# Patient Record
Sex: Male | Born: 1965 | Race: White | Hispanic: No | Marital: Married | Smoking: Never smoker
Health system: Southern US, Community
[De-identification: ages and names within clinical notes are randomized; demographics above are authoritative.]

## PROBLEM LIST (undated history)

## (undated) DIAGNOSIS — G4730 Sleep apnea, unspecified: Secondary | ICD-10-CM

## (undated) DIAGNOSIS — I10 Essential (primary) hypertension: Secondary | ICD-10-CM

## (undated) DIAGNOSIS — E119 Type 2 diabetes mellitus without complications: Secondary | ICD-10-CM

## (undated) DIAGNOSIS — E785 Hyperlipidemia, unspecified: Secondary | ICD-10-CM

## (undated) HISTORY — DX: Type 2 diabetes mellitus without complications: E11.9

## (undated) HISTORY — PX: ELBOW SURGERY: SHX618

## (undated) HISTORY — DX: Essential (primary) hypertension: I10

## (undated) HISTORY — PX: KNEE SURGERY: SHX244

## (undated) HISTORY — DX: Hyperlipidemia, unspecified: E78.5

---

## 2001-04-27 ENCOUNTER — Ambulatory Visit (HOSPITAL_COMMUNITY): Admission: RE | Admit: 2001-04-27 | Discharge: 2001-04-27 | Payer: Self-pay | Admitting: Orthopedic Surgery

## 2001-04-27 ENCOUNTER — Encounter (INDEPENDENT_AMBULATORY_CARE_PROVIDER_SITE_OTHER): Payer: Self-pay | Admitting: Specialist

## 2001-11-03 ENCOUNTER — Ambulatory Visit (HOSPITAL_COMMUNITY): Admission: RE | Admit: 2001-11-03 | Discharge: 2001-11-03 | Payer: Self-pay | Admitting: Family Medicine

## 2001-11-03 ENCOUNTER — Encounter: Payer: Self-pay | Admitting: Family Medicine

## 2001-11-17 ENCOUNTER — Ambulatory Visit (HOSPITAL_COMMUNITY): Admission: RE | Admit: 2001-11-17 | Discharge: 2001-11-17 | Payer: Self-pay | Admitting: *Deleted

## 2001-11-21 ENCOUNTER — Ambulatory Visit: Admission: RE | Admit: 2001-11-21 | Discharge: 2001-11-21 | Payer: Self-pay | Admitting: Family Medicine

## 2002-01-04 ENCOUNTER — Encounter: Admission: RE | Admit: 2002-01-04 | Discharge: 2002-04-04 | Payer: Self-pay | Admitting: Family Medicine

## 2004-03-18 ENCOUNTER — Ambulatory Visit (HOSPITAL_COMMUNITY): Admission: RE | Admit: 2004-03-18 | Discharge: 2004-03-18 | Payer: Self-pay | Admitting: Specialist

## 2004-04-09 ENCOUNTER — Ambulatory Visit (HOSPITAL_COMMUNITY): Admission: RE | Admit: 2004-04-09 | Discharge: 2004-04-09 | Payer: Self-pay | Admitting: Orthopedic Surgery

## 2004-12-24 ENCOUNTER — Ambulatory Visit (HOSPITAL_COMMUNITY): Admission: RE | Admit: 2004-12-24 | Discharge: 2004-12-24 | Payer: Self-pay | Admitting: Orthopedic Surgery

## 2005-03-02 ENCOUNTER — Ambulatory Visit (HOSPITAL_COMMUNITY): Admission: RE | Admit: 2005-03-02 | Discharge: 2005-03-03 | Payer: Self-pay | Admitting: Orthopedic Surgery

## 2005-04-27 ENCOUNTER — Ambulatory Visit (HOSPITAL_COMMUNITY): Admission: RE | Admit: 2005-04-27 | Discharge: 2005-04-27 | Payer: Self-pay | Admitting: Orthopedic Surgery

## 2010-01-07 ENCOUNTER — Emergency Department (HOSPITAL_COMMUNITY): Admission: EM | Admit: 2010-01-07 | Discharge: 2010-01-07 | Payer: Self-pay | Admitting: Emergency Medicine

## 2010-01-15 ENCOUNTER — Emergency Department (HOSPITAL_COMMUNITY): Admission: EM | Admit: 2010-01-15 | Discharge: 2010-01-15 | Payer: Self-pay | Admitting: Emergency Medicine

## 2010-06-03 LAB — GLUCOSE, CAPILLARY: Glucose-Capillary: 119 mg/dL — ABNORMAL HIGH (ref 70–99)

## 2010-08-07 NOTE — Op Note (Signed)
[facility]  Patient:    Kevin Strong, Kevin Strong Visit Number: [id number] MRN: [id number]          Service Type: DSU Location: DAY Attending Physician:  Marlowe Kays Page Dictated by:   Illene Labrador. Aplington, M.D. Proc. Date: 04/27/01 Admit Date:  04/27/2001                             Operative Report  PREOPERATIVE DIAGNOSIS:  Nontraumatic olecranon bursitis, right elbow.  POSTOPERATIVE DIAGNOSIS:  Nontraumatic olecrenon bursitis, right elbow.  OPERATION PERFORMED:  Olecranon bursectomy, right elbow.  SURGEON:  Illene Labrador. Aplington, M.D.  ASSISTANT:  Nurse.  ANESTHESIA:  General.  PATHOLOGY AND JUSTIFICATION FOR PROCEDURE:  He struck his elbow on some concrete on December 26 or about six weeks ago at work. X-rays have been normal but he has had a persistent nodularity which is movable and painful to him and since it has failed to resolve at this length of time and he is significantly disabled by this, he is here today for the above mentioned surgery. See operative description below for details and pathology.  DESCRIPTION OF PROCEDURE:  Satisfactory general anesthesia, pneumatic tourniquet, the right arm was prepped with Duraprep, draped in a sterile field, arm was placed across the chest, and a small posterior radial curved incision was made around the olecranon into the olecranon bursa. The bursa had a cheesy type of appearance with a number of thickened bands. There were no discreet floating nodules but there was a good bit of nodularity from the fibrotic lining. I performed an olecranon bursectomy removing all of the diseased abnormal looking tissue. This was sent to pathology. I then released the tourniquet. Some minimal small bleeders were coagulated and the wound was basically dry on closure. I closed the skin and subcutaneous tissue with interrupted 3-0 nylon, infiltrated the soft tissues with 0.5% Marcaine with adrenaline and placed Betadine  Adaptic dry sterile dressing. He tolerated the procedure well and was taken to the recovery room in satisfactory condition with no known complications. Dictated by:   Illene Labrador. Aplington, M.D. Attending Physician:  Joaquin Courts DD:  04/27/01 TD:  04/28/01 Job: [id number]

## 2010-08-07 NOTE — Op Note (Signed)
NAMEAYOMIDE, PURDY              ACCOUNT NO.:  [id number]   MEDICAL RECORD NO.:  [id number]          PATIENT TYPE:  OIB   LOCATION:  1518                         FACILITY:  [facility]   PHYSICIAN:  Ollen Gross, M.D.    DATE OF BIRTH:  12/15/1965   DATE OF PROCEDURE:  03/02/2005  DATE OF DISCHARGE:                                 OPERATIVE REPORT   PREOPERATIVE DIAGNOSIS:  Right knee medial meniscal tear.   POSTOPERATIVE DIAGNOSIS:  Right knee medial meniscal tear.   PROCEDURE:  Right knee arthroscopy with meniscal debridement.   SURGEON:  Ollen Gross, M.D.   ASSISTANT:  None.   ANESTHESIA:  Local with MAC.   ESTIMATED BLOOD LOSS:  Minimal.   DRAINS:  None.   COMPLICATIONS:  None.   CONDITION:  Stable to recovery.   CLINICAL NOTE:  Remus is a 46 year old male who had an on-the-job injury a  couple months ago. He has had progressive right knee medial sided pain with  mechanical symptoms. Exam was suggestive of medial meniscal tear. MRI did  show a radial tear. He presents now for arthroscopic debridement.   PROCEDURE IN DETAIL:  After successful administration of local with MAC  anesthetic, the patient's right lower extremity was prepped and draped in  the usual sterile fashion. I anesthetized the superomedial, inferomedial and  inferolateral portal sites with a total of 20 mL of 1% Xylocaine.  Superomedial and inferolateral incisions were made and the inflow cannula  passed superomedial and camera passed inferolateral. Arthroscopic  visualization proceeds. The undersurface of the patella and the trochlea  look minimally involved with chondromalacia. He has got some grade 1 and 2  on the patella but no focal defects. On the trochlea, he has got some grade  2 changes. Again there were no focal defects. The medial lateral gutters  were visualized and there were no loose bodies. Flexion and valgus force was  applied to the knee and the medial compartment was entered. He  does have a  radial tear and fibrillation at the body of the medial meniscus. The  posterior horn appears uninvolved at first. We then used a spinal needle to  localize the inferomedial portal, made a small incision and dilated it. I  placed a probe and indeed there is a tear within the posterior horn of the  medial meniscus. It appears to be contiguous with the body tear. We used a  combination of baskets and a 4.2 mm shaver to debride the meniscus back to a  stable base. I removed only about 20% of the posterior horn up to the body.  It is again probed and found to be stable. The chondral surfaces showed  minimal chondromalacia. The intercondylar notch was visualized, ACL appears  normal. The lateral compartment was entered and it is normal. The unstable  cartilage on the trochlea is debrided back to a stable cartilaginous base.  This is a very small area and there was no exposed bone. Arthroscopic  equipment was then removed from the  inferior portals which are closed with interrupted 4-0 nylon. 20 mL of 0.25%  Marcaine with epinephrine injected through the inflow cannula and then that  cannula is removed and that portal closed with nylon. A bulky sterile  dressing is then applied and he is awakened and transported to recovery in  stable condition.      Ollen Gross, M.D.  Electronically Signed     FA/MEDQ  D:  03/02/2005  T:  03/03/2005  Job:  [id number]

## 2010-08-07 NOTE — Op Note (Signed)
Kevin Strong, Kevin Strong              ACCOUNT NO.:  [id number]   MEDICAL RECORD NO.:  [id number]          PATIENT TYPE:  AMB   LOCATION:  DAY                          FACILITY:  [facility]   PHYSICIAN:  Marlowe Kays, M.D.  DATE OF BIRTH:  1965/12/16   DATE OF PROCEDURE:  04/09/2004  DATE OF DISCHARGE:                                 OPERATIVE REPORT   PREOPERATIVE DIAGNOSIS:  Torn medial meniscus left knee.   POSTOPERATIVE DIAGNOSIS:  Torn medial meniscus left knee.   OPERATION:  Left knee arthroscopy with partial medial meniscectomy.   SURGEON:  Marlowe Kays, M.D.   ASSISTANT:  Nurse.   ANESTHESIA:  General.   PATHOLOGY/JUSTIFICATION FOR PROCEDURE:  He injured his left knee when he  stepped in a hole at work on February 14, 2004.  Because of persistent pain  in the knee medially, I sent him for an MRI which demonstrated a posterior  horn tear of the medial meniscus and there is tibial attachment.  As noted  during the dictation, he also had significant disruption of the anterior  third of the medial meniscus as well.   PROCEDURE:  Under satisfactory general anesthesia, pneumatic tourniquet,  thigh stabilizer, knee was esmarched nonsterilely and prepped from thigh  stabilizer to ankle with Duraprep, draped in a sterile field.  Ace wrap and  knee protector for the right knee, superior and medial saline inflow.  First, through an anterolateral portal the medial compartment knee joint was  evaluated.  He had a good bit of synovitis and disruption of the anterior  third of the medial meniscus with a small, firm, cystic or loose body noted  along the anterior medial attachment of the meniscus.  There was also a flap  noted.  Posteriorly, in the intercondylar area he had a small disruption  with detachment.  I initially started cleaning up the synovium and the  anterior third of the medial meniscus and found that he did have a bucket-  handle type tear which was pictured.  I resected  this portion of the tear  with scissors posteriorly, removing the anterior flap and shaving down the  remainder of the meniscus with a 3.5 shaver.  Then working posteriorly, I  resected the intercondylar tear with a combination of baskets and smoothed  down with a 3.5 shaver until this area was stable.  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  Looking up in the medial gutter and suprapatellar area, no significant  abnormalities and nothing arthroscopically treatable was noted.  __________  portals.  He had some minimal fraying of lateral meniscus but basically a  normal looking lateral compartment of the knee joint.  The knee joint was  then irrigated __________removed.  The two anterior portals were closed with  4-0 nylon.  I then injected the knee with 4 mg of morphine and 20 mL of 0.5%  Marcaine with adrenaline through the inflow apparatus, which was removed,  and this portal closed with 4-0 nylon as well.  Betadine, Adaptic, dry  sterile dressing were  applied, tourniquet was released.  He tolerated the  procedure well and was taken to the recovery room in satisfactory condition  with no known complications.      JA/MEDQ  D:  04/09/2004  T:  04/09/2004  Job:  [id number]

## 2014-10-02 ENCOUNTER — Ambulatory Visit (HOSPITAL_COMMUNITY)
Admission: RE | Admit: 2014-10-02 | Discharge: 2014-10-02 | Disposition: A | Discharge status: Home / Self Care | Payer: BLUE CROSS/BLUE SHIELD | Source: Ambulatory Visit | Attending: Family Medicine | Admitting: Family Medicine

## 2014-10-02 ENCOUNTER — Other Ambulatory Visit (HOSPITAL_COMMUNITY): Payer: Self-pay | Admitting: Family Medicine

## 2014-10-02 DIAGNOSIS — M25511 Pain in right shoulder: Secondary | ICD-10-CM | POA: Diagnosis not present

## 2016-05-07 DIAGNOSIS — E119 Type 2 diabetes mellitus without complications: Secondary | ICD-10-CM | POA: Diagnosis not present

## 2016-05-07 DIAGNOSIS — I10 Essential (primary) hypertension: Secondary | ICD-10-CM | POA: Diagnosis not present

## 2016-05-07 DIAGNOSIS — E1165 Type 2 diabetes mellitus with hyperglycemia: Secondary | ICD-10-CM | POA: Diagnosis not present

## 2016-05-07 DIAGNOSIS — R600 Localized edema: Secondary | ICD-10-CM | POA: Diagnosis not present

## 2016-05-07 DIAGNOSIS — Z1389 Encounter for screening for other disorder: Secondary | ICD-10-CM | POA: Diagnosis not present

## 2016-07-23 DIAGNOSIS — E1165 Type 2 diabetes mellitus with hyperglycemia: Secondary | ICD-10-CM | POA: Diagnosis not present

## 2016-08-12 DIAGNOSIS — Z1211 Encounter for screening for malignant neoplasm of colon: Secondary | ICD-10-CM | POA: Diagnosis not present

## 2016-10-29 DIAGNOSIS — I10 Essential (primary) hypertension: Secondary | ICD-10-CM | POA: Diagnosis not present

## 2016-10-29 DIAGNOSIS — G4733 Obstructive sleep apnea (adult) (pediatric): Secondary | ICD-10-CM | POA: Diagnosis not present

## 2016-10-29 DIAGNOSIS — T24231A Burn of second degree of right lower leg, initial encounter: Secondary | ICD-10-CM | POA: Diagnosis not present

## 2017-02-04 DIAGNOSIS — E1165 Type 2 diabetes mellitus with hyperglycemia: Secondary | ICD-10-CM | POA: Diagnosis not present

## 2017-02-04 DIAGNOSIS — E291 Testicular hypofunction: Secondary | ICD-10-CM | POA: Diagnosis not present

## 2017-04-14 DIAGNOSIS — E083291 Diabetes mellitus due to underlying condition with mild nonproliferative diabetic retinopathy without macular edema, right eye: Secondary | ICD-10-CM | POA: Diagnosis not present

## 2017-04-14 DIAGNOSIS — E113291 Type 2 diabetes mellitus with mild nonproliferative diabetic retinopathy without macular edema, right eye: Secondary | ICD-10-CM | POA: Diagnosis not present

## 2017-05-04 DIAGNOSIS — J22 Unspecified acute lower respiratory infection: Secondary | ICD-10-CM | POA: Diagnosis not present

## 2017-05-04 DIAGNOSIS — B349 Viral infection, unspecified: Secondary | ICD-10-CM | POA: Diagnosis not present

## 2017-05-20 DIAGNOSIS — E782 Mixed hyperlipidemia: Secondary | ICD-10-CM | POA: Diagnosis not present

## 2017-05-20 DIAGNOSIS — I10 Essential (primary) hypertension: Secondary | ICD-10-CM | POA: Diagnosis not present

## 2017-05-20 DIAGNOSIS — E119 Type 2 diabetes mellitus without complications: Secondary | ICD-10-CM | POA: Diagnosis not present

## 2017-05-20 DIAGNOSIS — E1142 Type 2 diabetes mellitus with diabetic polyneuropathy: Secondary | ICD-10-CM | POA: Diagnosis not present

## 2017-05-20 DIAGNOSIS — L84 Corns and callosities: Secondary | ICD-10-CM | POA: Diagnosis not present

## 2017-08-19 DIAGNOSIS — Z6841 Body Mass Index (BMI) 40.0 and over, adult: Secondary | ICD-10-CM | POA: Diagnosis not present

## 2017-09-02 ENCOUNTER — Other Ambulatory Visit (HOSPITAL_COMMUNITY): Payer: Self-pay | Admitting: Internal Medicine

## 2017-09-02 DIAGNOSIS — Z0001 Encounter for general adult medical examination with abnormal findings: Secondary | ICD-10-CM | POA: Diagnosis not present

## 2017-09-02 DIAGNOSIS — R1310 Dysphagia, unspecified: Secondary | ICD-10-CM

## 2017-09-19 ENCOUNTER — Encounter: Payer: Self-pay | Admitting: Internal Medicine

## 2017-10-11 ENCOUNTER — Ambulatory Visit: Payer: Self-pay | Admitting: "Endocrinology

## 2017-12-16 DIAGNOSIS — E119 Type 2 diabetes mellitus without complications: Secondary | ICD-10-CM | POA: Diagnosis not present

## 2017-12-16 DIAGNOSIS — L03031 Cellulitis of right toe: Secondary | ICD-10-CM | POA: Diagnosis not present

## 2017-12-16 DIAGNOSIS — R201 Hypoesthesia of skin: Secondary | ICD-10-CM | POA: Diagnosis not present

## 2017-12-16 DIAGNOSIS — L84 Corns and callosities: Secondary | ICD-10-CM | POA: Diagnosis not present

## 2017-12-20 ENCOUNTER — Ambulatory Visit: Payer: BLUE CROSS/BLUE SHIELD | Admitting: Nurse Practitioner

## 2018-03-31 DIAGNOSIS — R201 Hypoesthesia of skin: Secondary | ICD-10-CM | POA: Diagnosis not present

## 2018-03-31 DIAGNOSIS — L84 Corns and callosities: Secondary | ICD-10-CM | POA: Diagnosis not present

## 2018-03-31 DIAGNOSIS — E1165 Type 2 diabetes mellitus with hyperglycemia: Secondary | ICD-10-CM | POA: Diagnosis not present

## 2018-07-10 DIAGNOSIS — K219 Gastro-esophageal reflux disease without esophagitis: Secondary | ICD-10-CM | POA: Diagnosis not present

## 2018-07-10 DIAGNOSIS — E119 Type 2 diabetes mellitus without complications: Secondary | ICD-10-CM | POA: Diagnosis not present

## 2018-07-10 DIAGNOSIS — R079 Chest pain, unspecified: Secondary | ICD-10-CM | POA: Diagnosis not present

## 2018-07-11 ENCOUNTER — Other Ambulatory Visit: Payer: Self-pay | Admitting: Internal Medicine

## 2018-07-11 ENCOUNTER — Other Ambulatory Visit (HOSPITAL_COMMUNITY): Payer: Self-pay | Admitting: Internal Medicine

## 2018-07-11 DIAGNOSIS — E119 Type 2 diabetes mellitus without complications: Secondary | ICD-10-CM | POA: Diagnosis not present

## 2018-07-11 DIAGNOSIS — Z6841 Body Mass Index (BMI) 40.0 and over, adult: Secondary | ICD-10-CM | POA: Diagnosis not present

## 2018-07-11 DIAGNOSIS — R1310 Dysphagia, unspecified: Secondary | ICD-10-CM

## 2018-10-10 ENCOUNTER — Encounter: Payer: Self-pay | Admitting: Internal Medicine

## 2018-11-13 ENCOUNTER — Ambulatory Visit: Payer: BLUE CROSS/BLUE SHIELD | Admitting: Gastroenterology

## 2019-02-05 ENCOUNTER — Other Ambulatory Visit: Payer: Self-pay | Admitting: Sports Medicine

## 2019-02-05 ENCOUNTER — Other Ambulatory Visit: Payer: Self-pay

## 2019-02-05 ENCOUNTER — Ambulatory Visit (HOSPITAL_COMMUNITY)
Admission: RE | Admit: 2019-02-05 | Discharge: 2019-02-05 | Disposition: A | Discharge status: Home / Self Care | Payer: 59 | Source: Ambulatory Visit | Attending: Sports Medicine | Admitting: Sports Medicine

## 2019-02-05 DIAGNOSIS — S76301A Unspecified injury of muscle, fascia and tendon of the posterior muscle group at thigh level, right thigh, initial encounter: Secondary | ICD-10-CM | POA: Insufficient documentation

## 2019-03-06 ENCOUNTER — Ambulatory Visit: Payer: BLUE CROSS/BLUE SHIELD | Admitting: Cardiovascular Disease

## 2019-03-09 ENCOUNTER — Encounter: Payer: Self-pay | Admitting: Cardiovascular Disease

## 2020-01-31 ENCOUNTER — Other Ambulatory Visit (HOSPITAL_COMMUNITY): Payer: Self-pay | Admitting: Internal Medicine

## 2020-01-31 ENCOUNTER — Ambulatory Visit (HOSPITAL_COMMUNITY)
Admission: RE | Admit: 2020-01-31 | Discharge: 2020-01-31 | Disposition: A | Discharge status: Home / Self Care | Payer: 59 | Source: Ambulatory Visit | Attending: Internal Medicine | Admitting: Internal Medicine

## 2020-01-31 ENCOUNTER — Other Ambulatory Visit: Payer: Self-pay

## 2020-01-31 DIAGNOSIS — L03031 Cellulitis of right toe: Secondary | ICD-10-CM

## 2020-02-06 ENCOUNTER — Other Ambulatory Visit: Payer: Self-pay | Admitting: Family Medicine

## 2020-02-06 DIAGNOSIS — L989 Disorder of the skin and subcutaneous tissue, unspecified: Secondary | ICD-10-CM

## 2020-02-11 ENCOUNTER — Encounter: Payer: Self-pay | Admitting: Family Medicine

## 2020-02-26 ENCOUNTER — Ambulatory Visit: Payer: Self-pay | Admitting: General Surgery

## 2021-10-09 LAB — COMPREHENSIVE METABOLIC PANEL
Albumin: 4.2 (ref 3.5–5.0)
Calcium: 9.5 (ref 8.7–10.7)
Globulin: 2.5

## 2021-10-09 LAB — BASIC METABOLIC PANEL
BUN: 23 — AB (ref 4–21)
CO2: 21 (ref 13–22)
Chloride: 106 (ref 99–108)
Creatinine: 0.9 (ref 0.6–1.3)
Glucose: 218
Potassium: 4.1 mEq/L (ref 3.5–5.1)
Sodium: 141 (ref 137–147)

## 2021-10-09 LAB — HEPATIC FUNCTION PANEL
ALT: 23 U/L (ref 10–40)
AST: 15 (ref 14–40)
Alkaline Phosphatase: 73 (ref 25–125)
Bilirubin, Total: 0.3

## 2021-10-09 LAB — HEMOGLOBIN A1C: Hemoglobin A1C: 10

## 2021-10-09 LAB — TSH: TSH: 0.99 (ref 0.41–5.90)

## 2021-10-12 LAB — LIPID PANEL
Cholesterol: 127 (ref 0–200)
HDL: 26 — AB (ref 35–70)
LDL Cholesterol: 60
Triglycerides: 254 — AB (ref 40–160)

## 2021-11-03 ENCOUNTER — Encounter: Payer: Self-pay | Admitting: "Endocrinology

## 2021-11-03 ENCOUNTER — Ambulatory Visit: Payer: 59 | Admitting: "Endocrinology

## 2021-11-03 VITALS — BP 120/70 | HR 88 | Ht 72.0 in | Wt 286.4 lb

## 2021-11-03 DIAGNOSIS — E782 Mixed hyperlipidemia: Secondary | ICD-10-CM | POA: Diagnosis not present

## 2021-11-03 DIAGNOSIS — I10 Essential (primary) hypertension: Secondary | ICD-10-CM | POA: Insufficient documentation

## 2021-11-03 DIAGNOSIS — Z6838 Body mass index (BMI) 38.0-38.9, adult: Secondary | ICD-10-CM

## 2021-11-03 DIAGNOSIS — E1165 Type 2 diabetes mellitus with hyperglycemia: Secondary | ICD-10-CM | POA: Diagnosis not present

## 2021-11-03 DIAGNOSIS — Z794 Long term (current) use of insulin: Secondary | ICD-10-CM

## 2021-11-03 DIAGNOSIS — E66812 Obesity, class 2: Secondary | ICD-10-CM | POA: Insufficient documentation

## 2021-11-03 MED ORDER — FREESTYLE LIBRE 2 READER DEVI: 0 refills | Status: DC

## 2021-11-03 MED ORDER — ACCU-CHEK GUIDE ME W/DEVICE KIT: 1.0000 | PACK | 0 refills | Status: AC

## 2021-11-03 MED ORDER — ACCU-CHEK GUIDE VI STRP: ORAL_STRIP | 2 refills | Status: AC

## 2021-11-03 MED ORDER — GLIPIZIDE ER 5 MG PO TB24: 5.0000 mg | ORAL_TABLET | Freq: Every day | ORAL | 1 refills | Status: AC

## 2021-11-03 MED ORDER — FREESTYLE LIBRE 2 SENSOR MISC: 1.0000 | 3 refills | Status: DC

## 2021-11-03 NOTE — Patient Instructions (Signed)

## 2021-11-03 NOTE — Progress Notes (Signed)
Endocrinology Consult Note       11/03/2021, 10:25 AM   Subjective:    Patient ID: Kevin Strong, male    DOB: Apr 24, 1965.  Kevin Strong is being seen in consultation for management of currently uncontrolled symptomatic diabetes requested by  Redmond School, MD.   Past Medical History:  Diagnosis Date   Diabetes mellitus, type II ([place])    Hyperlipidemia    Hypertension     Past Surgical History:  Procedure Laterality Date   ELBOW SURGERY     KNEE SURGERY      Social History   Socioeconomic History   Marital status: Married    Spouse name: Not on file   Number of children: Not on file   Years of education: Not on file   Highest education level: Not on file  Occupational History   Not on file  Tobacco Use   Smoking status: Never   Smokeless tobacco: Not on file  Vaping Use   Vaping Use: Never used  Substance and Sexual Activity   Alcohol use: Never   Drug use: Never   Sexual activity: Not on file  Other Topics Concern   Not on file  Social History Narrative   Not on file   Social Determinants of Health   Financial Resource Strain: Not on file  Food Insecurity: Not on file  Transportation Needs: Not on file  Physical Activity: Not on file  Stress: Not on file  Social Connections: Not on file    Family History  Problem Relation Age of Onset   Hypertension Mother    Thyroid disease Mother    Diabetes Mother    Hypertension Father    Hyperlipidemia Father     Outpatient Encounter Medications as of 11/03/2021  Medication Sig   aspirin EC 81 MG tablet Take 81 mg by mouth daily. Swallow whole.   Blood Glucose Monitoring Suppl (ACCU-CHEK GUIDE ME) w/Device KIT 1 Piece by Does not apply route as directed.   Continuous Blood Gluc Receiver (FREESTYLE LIBRE 2 READER) DEVI As directed   Continuous Blood Gluc Sensor (FREESTYLE LIBRE 2 SENSOR) MISC 1 Piece by Does not apply  route every 14 (fourteen) days.   glipiZIDE (GLUCOTROL XL) 5 MG 24 hr tablet Take 1 tablet (5 mg total) by mouth daily with breakfast.   glucose blood (ACCU-CHEK GUIDE) test strip Use as instructed   lidocaine (XYLOCAINE) 5 % ointment Apply 1 Application topically 4 (four) times daily as needed.   [DISCONTINUED] glimepiride (AMARYL) 4 MG tablet Take 8 mg by mouth daily with breakfast.   atorvastatin (LIPITOR) 40 MG tablet Take 40 mg by mouth daily.   furosemide (LASIX) 20 MG tablet Take 20 mg by mouth daily as needed.   gabapentin (NEURONTIN) 600 MG tablet Take 1-4 tablets by mouth 4 (four) times daily.   lisinopril (ZESTRIL) 2.5 MG tablet Take 2.5 mg by mouth daily.   meloxicam (MOBIC) 15 MG tablet Take 15 mg by mouth daily.   meloxicam (MOBIC) 7.5 MG tablet Take 7.5 mg by mouth 2 (two) times daily as needed.   montelukast (SINGULAIR) 10 MG tablet  Take 10 mg by mouth at bedtime. (Patient not taking: Reported on 11/03/2021)   pantoprazole (PROTONIX) 40 MG tablet Take 40 mg by mouth 2 (two) times daily. (Patient not taking: Reported on 11/03/2021)   [place] 100-33 UNT-MCG/ML SOPN Inject 60 Units into the skin daily.   tamsulosin (FLOMAX) 0.4 MG CAPS capsule Take 0.4 mg by mouth daily. (Patient not taking: Reported on 1/61/0960)   TRULICITY 3 AV/4.0JW SOPN Inject 3 mg into the skin once a week.   [DISCONTINUED] TRULICITY 1.5 JX/9.1YN SOPN Inject 1.5 mg into the skin once a week.   No facility-administered encounter medications on file as of 11/03/2021.    ALLERGIES: No Known Allergies  VACCINATION STATUS:  There is no immunization history on file for this patient.  Diabetes He presents for his initial diabetic visit. He has type 2 diabetes mellitus. Onset time: He was diagnosed at approximate age of 30 years. His disease course has been worsening. There are no hypoglycemic associated symptoms. Pertinent negatives for hypoglycemia include no confusion, headaches, pallor or seizures. Associated  symptoms include blurred vision, polydipsia and polyuria. Pertinent negatives for diabetes include no chest pain, no fatigue, no polyphagia and no weakness. There are no hypoglycemic complications. Symptoms are worsening. Diabetic complications include peripheral neuropathy. Risk factors for coronary artery disease include family history, dyslipidemia, male sex, obesity, sedentary lifestyle and diabetes mellitus. Current diabetic treatments: He is currently on glimepiride 4 mg p.o. twice daily, Soliqua 60 units daily, Trulicity 3.0 mg weekly. His weight is fluctuating minimally. He is following a generally unhealthy diet. When asked about meal planning, he reported none. He has not had a previous visit with a dietitian. He rarely participates in exercise. (He did not bring any meter nor logs with him.  Admittedly, he does not have a functioning meter.  His recent A1c was 10.9%.  He denies hypoglycemia.) An ACE inhibitor/angiotensin II receptor blocker is being taken. Eye exam is current.  Hyperlipidemia This is a chronic problem. The problem is uncontrolled. Pertinent negatives include no chest pain, myalgias or shortness of breath. Risk factors for coronary artery disease include dyslipidemia, diabetes mellitus, family history, male sex, hypertension, a sedentary lifestyle and obesity.  Hypertension This is a chronic problem. The current episode started more than 1 year ago. The problem is controlled. Associated symptoms include blurred vision. Pertinent negatives include no chest pain, headaches, neck pain, palpitations or shortness of breath. Risk factors for coronary artery disease include diabetes mellitus, dyslipidemia, family history, obesity, male gender and sedentary lifestyle. Past treatments include ACE inhibitors. The current treatment provides moderate improvement.     Review of Systems  Constitutional:  Negative for chills, fatigue, fever and unexpected weight change.  HENT:  Negative for  dental problem, mouth sores and trouble swallowing.   Eyes:  Positive for blurred vision. Negative for visual disturbance.  Respiratory:  Negative for cough, choking, chest tightness, shortness of breath and wheezing.   Cardiovascular:  Negative for chest pain, palpitations and leg swelling.  Gastrointestinal:  Negative for abdominal distention, abdominal pain, constipation, diarrhea, nausea and vomiting.  Endocrine: Positive for polydipsia and polyuria. Negative for polyphagia.  Genitourinary:  Negative for dysuria, flank pain, hematuria and urgency.  Musculoskeletal:  Negative for back pain, gait problem, myalgias and neck pain.  Skin:  Negative for pallor, rash and wound.  Neurological:  Negative for seizures, syncope, weakness, numbness and headaches.  Psychiatric/Behavioral: Negative.  Negative for confusion and dysphoric mood.     Objective:  11/03/2021    8:13 AM  Vitals with BMI  Height _0   Weight 286 lbs 6 oz  BMI 31.49  Systolic 702  Diastolic 70  Pulse 88    BP 120/70   Pulse 88   Ht 6' (1.829 m)   Wt 286 lb 6.4 oz (129.9 kg)   BMI 38.84 kg/m   Wt Readings from Last 3 Encounters:  11/03/21 286 lb 6.4 oz (129.9 kg)     Physical Exam Constitutional:      General: He is not in acute distress.    Appearance: He is well-developed.  HENT:     Head: Normocephalic and atraumatic.  Neck:     Thyroid: No thyromegaly.     Trachea: No tracheal deviation.  Cardiovascular:     Rate and Rhythm: Normal rate.     Pulses:          Dorsalis pedis pulses are 1+ on the right side and 1+ on the left side.       Posterior tibial pulses are 1+ on the right side and 1+ on the left side.     Heart sounds: Normal heart sounds, S1 normal and S2 normal. No murmur heard.    No gallop.  Pulmonary:     Effort: Pulmonary effort is normal. No respiratory distress.     Breath sounds: Normal breath sounds. No wheezing.  Abdominal:     General: Bowel sounds are normal. There is  no distension.     Palpations: Abdomen is soft.     Tenderness: There is no abdominal tenderness. There is no guarding.  Musculoskeletal:     Right shoulder: No swelling or deformity.     Cervical back: Normal range of motion and neck supple.  Skin:    General: Skin is warm and dry.     Findings: No rash.     Nails: There is no clubbing.  Neurological:     Mental Status: He is alert and oriented to person, place, and time.     Cranial Nerves: No cranial nerve deficit.     Sensory: No sensory deficit.     Gait: Gait normal.     Deep Tendon Reflexes: Reflexes are normal and symmetric.  Psychiatric:        Speech: Speech normal.        Behavior: Behavior normal. Behavior is cooperative.        Thought Content: Thought content normal.        Judgment: Judgment normal.       CMP ( most recent) CMP     Component Value Date/Time   NA 141 10/09/2021 0000   K 4.1 10/09/2021 0000   CL 106 10/09/2021 0000   CO2 21 10/09/2021 0000   BUN 23 (A) 10/09/2021 0000   CREATININE 0.9 10/09/2021 0000   CALCIUM 9.5 10/09/2021 0000   ALBUMIN 4.2 10/09/2021 0000   AST 15 10/09/2021 0000   ALT 23 10/09/2021 0000   ALKPHOS 73 10/09/2021 0000     Diabetic Labs (most recent): Lab Results  Component Value Date   HGBA1C 10.0 10/09/2021     Lipid Panel ( most recent) Lipid Panel     Component Value Date/Time   CHOL 127 10/09/2021 0000   TRIG 254 (A) 10/09/2021 0000   HDL 26 (A) 10/09/2021 0000   LDLCALC 60 10/09/2021 0000      Lab Results  Component Value Date   TSH 0.99 10/09/2021      Assessment &  Plan:   1. Type 2 diabetes mellitus with hyperglycemia, with long-term current use of insulin (Kevin Strong)  - Kevin Strong has currently uncontrolled symptomatic type 2 DM since  56 years of age.  He did not bring any meter nor logs with him.  Admittedly, he does not have a functioning meter.  His recent A1c was 10.9%.  He denies hypoglycemia.   Recent labs reviewed. - I had a  long discussion with him about the progressive nature of diabetes and the pathology behind its complications. -his diabetes is complicated by obesity/sedentary life, comorbid hypertension/ hyperlipidemia , polypharmacy, sleep apnea and he remains at a high risk for more acute and chronic complications which include CAD, CVA, CKD, retinopathy, and neuropathy. These are all discussed in detail with him.  - I discussed all available options of managing his diabetes including de-escalation of medications. I have counseled him on diet  and weight management  by adopting a Whole Food , Plant Predominant  ( WFPP) nutrition as recommended by [employer] of Lifestyle Medicine. Patient is encouraged to switch to  unprocessed or minimally processed  complex starch, adequate protein intake (mainly plant source), minimal liquid fat ( mainly vegetable oils), plenty of fruits, and vegetables. -  he is advised to stick to a routine mealtimes to eat 3 complete meals a day and snack only when necessary ( to snack only to correct hypoglycemia BG <70 day time or <100 at night).   - he acknowledges that there is a room for improvement in his food and drink choices. - Further Specific Suggestion is made for him to avoid simple carbohydrates  from his diet including Cakes, Sweet Desserts, Ice Cream, Soda (diet and regular), Sweet Tea, Candies, Chips, Cookies, Store Bought Juices, Alcohol ,  Artificial Sweeteners,  Coffee Creamer, and "Sugar-free" Products. This will help patient to have more stable blood glucose profile and potentially avoid unintended weight gain.  The following Lifestyle Medicine recommendations according to [facility]) were discussed and offered to patient and he agrees to start the journey:  A. Whole Foods, Plant-based plate comprising of fruits and vegetables, plant-based proteins, whole-grain carbohydrates was discussed in detail with the patient.   A list for source  of those nutrients were also provided to the patient.  Patient will use only water or unsweetened tea for hydration. B.  The need to stay away from risky substances including alcohol, smoking; obtaining 7 to 9 hours of restorative sleep, at least 150 minutes of moderate intensity exercise weekly, the importance of healthy social connections,  and stress reduction techniques were discussed. C.  A full color page of  Calorie density of various food groups per pound showing examples of each food groups was provided to the patient.  - he will be scheduled with Jearld Fenton, RDN, CDE for individualized diabetes education.  - I have approached him with the following plan to manage  his diabetes and patient agrees:   -This patient will benefit from de-escalation of medications especially if he is engaging with lifestyle medicine.   Accordingly, he is advised to continue Soliqua 60 units daily at breakfast, discontinue glimepiride.  His Trulicity and Yvone Neu is a risk of double exposure to GLP-1 receptor agonists.  His Willeen Niece will be discontinued and replaced with Lantus as a basal insulin during his next visit.   Discussed and initiated glipizide 5 mg XL p.o. daily at breakfast.  I prescribed a new meter for him.  He  will also benefit from a CGM, discussed and prescribed the freestyle libre device for him.  He is urged to return in 10 days with his meter and logs for evaluation.  If he presents with significant postprandial hyperglycemia burden, he will be considered for rapid acting insulin during meals. - he is warned not to take insulin without proper monitoring per orders. - Adjustment parameters are given to him for hypo and hyperglycemia in writing. - he is encouraged to call clinic for blood glucose levels less than 70 or above 200 mg /dl.  - Specific targets for  A1c;  LDL, HDL,  and Triglycerides were discussed with the patient.  2) Blood Pressure /Hypertension:  his blood pressure is   controlled to target.   he is advised to continue his current medications including lisinopril 2.5 mg p.o. daily with breakfast . 3) Lipids/Hyperlipidemia:   Review of his recent lipid panel showed un controlled on the right side/254, LDL at 60.    he  is advised to continue    atorvastatin 40 mg daily at bedtime.  Side effects and precautions discussed with him.  Food plant-based diet will help him address his triglyceridemia.   4)  Weight/Diet:  Body mass index is 38.84 kg/m.  -   clearly complicating his diabetes care.   he is  a candidate for weight loss. I discussed with him the fact that loss of 5 - 10% of his  current body weight will have the most impact on his diabetes management.  The above detailed  ACLM recommendations for nutrition, exercise, sleep, social life, avoidance of risky substances, the need for restorative sleep   information will also detailed on discharge instructions.  5) Chronic Care/Health Maintenance:  -he  is on ACEI/ARB and Statin medications and  is encouraged to initiate and continue to follow up with Ophthalmology, Dentist,  Podiatrist at least yearly or according to recommendations, and advised to   stay away from smoking. I have recommended yearly flu vaccine and pneumonia vaccine at least every 5 years; moderate intensity exercise for up to 150 minutes weekly; and  sleep for 7- 9 hours a day.  - he is  advised to maintain close follow up with Redmond School, MD for primary care needs, as well as his other providers for optimal and coordinated care.   I spent 66 minutes in the care of the patient today including review of labs from [facility], Lipids, Thyroid Function, Hematology (current and previous including abstractions from other facilities); face-to-face time discussing  his blood glucose readings/logs, discussing hypoglycemia and hyperglycemia episodes and symptoms, medications doses, his options of short and long term treatment based on the latest standards of care  / guidelines;  discussion about incorporating lifestyle medicine;  and documenting the encounter. Risk reduction counseling performed per USPSTF guidelines to reduce obesity and cardiovascular risk factors.      Please refer to Patient Instructions for Blood Glucose Monitoring and Insulin/Medications Dosing Guide"  in media tab for additional information. Please  also refer to " Patient Self Inventory" in the Media  tab for reviewed elements of pertinent patient history.  Patricia Nettle participated in the discussions, expressed understanding, and voiced agreement with the above plans.  All questions were answered to his satisfaction. he is encouraged to contact clinic should he have any questions or concerns prior to his return visit.   Follow up plan: - Return in about 10 days (around 11/13/2021) for F/U with Meter/CGM Edison Simon Only -  no Labs.  Glade Lloyd, MD [facility] [address] [place], [place] [address] Phone: [phone]  Fax: [phone]    11/03/2021, 10:25 AM  This note was partially dictated with voice recognition software. Similar sounding words can be transcribed inadequately or may not  be corrected upon review.

## 2021-11-16 ENCOUNTER — Ambulatory Visit: Payer: 59 | Admitting: "Endocrinology

## 2021-11-16 ENCOUNTER — Encounter: Payer: Self-pay | Admitting: "Endocrinology

## 2021-11-16 VITALS — BP 136/84 | HR 76 | Ht 72.0 in | Wt 286.2 lb

## 2021-11-16 DIAGNOSIS — E1165 Type 2 diabetes mellitus with hyperglycemia: Secondary | ICD-10-CM

## 2021-11-16 DIAGNOSIS — Z794 Long term (current) use of insulin: Secondary | ICD-10-CM

## 2021-11-16 DIAGNOSIS — I10 Essential (primary) hypertension: Secondary | ICD-10-CM

## 2021-11-16 DIAGNOSIS — Z6838 Body mass index (BMI) 38.0-38.9, adult: Secondary | ICD-10-CM

## 2021-11-16 DIAGNOSIS — E782 Mixed hyperlipidemia: Secondary | ICD-10-CM

## 2021-11-16 MED ORDER — FREESTYLE LIBRE 3 SENSOR MISC: 1.0000 | 2 refills | Status: DC

## 2021-11-16 NOTE — Progress Notes (Signed)
11/16/2021, 1:42 PM  Endocrinology follow-up note   Subjective:    Patient ID: Kevin Strong, male    DOB: 01-27-1966.  Kevin Strong is being seen f/u after he was seen in consultation for management of currently uncontrolled symptomatic diabetes requested by  Redmond School, MD.   Past Medical History:  Diagnosis Date   Diabetes mellitus, type II ([place])    Hyperlipidemia    Hypertension     Past Surgical History:  Procedure Laterality Date   ELBOW SURGERY     KNEE SURGERY      Social History   Socioeconomic History   Marital status: Married    Spouse name: Not on file   Number of children: Not on file   Years of education: Not on file   Highest education level: Not on file  Occupational History   Not on file  Tobacco Use   Smoking status: Never   Smokeless tobacco: Not on file  Vaping Use   Vaping Use: Never used  Substance and Sexual Activity   Alcohol use: Never   Drug use: Never   Sexual activity: Not on file  Other Topics Concern   Not on file  Social History Narrative   Not on file   Social Determinants of Health   Financial Resource Strain: Not on file  Food Insecurity: Not on file  Transportation Needs: Not on file  Physical Activity: Not on file  Stress: Not on file  Social Connections: Not on file    Family History  Problem Relation Age of Onset   Hypertension Mother    Thyroid disease Mother    Diabetes Mother    Hypertension Father    Hyperlipidemia Father     Outpatient Encounter Medications as of 11/16/2021  Medication Sig   Continuous Blood Gluc Sensor (FREESTYLE LIBRE 3 SENSOR) MISC 1 Piece by Does not apply route every 14 (fourteen) days. Place 1 sensor on the skin every 14 days. Use to check glucose continuously   Insulin Glargine-Lixisenatide 100-33 UNT-MCG/ML SOPN Inject 60 Units into the skin daily with breakfast.   aspirin EC 81 MG  tablet Take 81 mg by mouth daily. Swallow whole.   atorvastatin (LIPITOR) 40 MG tablet Take 40 mg by mouth daily.   Blood Glucose Monitoring Suppl (ACCU-CHEK GUIDE ME) w/Device KIT 1 Piece by Does not apply route as directed.   Continuous Blood Gluc Receiver (FREESTYLE LIBRE 2 READER) DEVI As directed   furosemide (LASIX) 20 MG tablet Take 20 mg by mouth daily as needed.   gabapentin (NEURONTIN) 600 MG tablet Take 1-4 tablets by mouth 4 (four) times daily.   glipiZIDE (GLUCOTROL XL) 5 MG 24 hr tablet Take 1 tablet (5 mg total) by mouth daily with breakfast.   glucose blood (ACCU-CHEK GUIDE) test strip Use as instructed   lidocaine (XYLOCAINE) 5 % ointment Apply 1 Application topically 4 (four) times daily as needed.   lisinopril (ZESTRIL) 2.5 MG tablet Take 2.5 mg by mouth daily.   meloxicam (MOBIC) 15 MG tablet Take 15 mg by mouth daily.   meloxicam (MOBIC) 7.5 MG  tablet Take 7.5 mg by mouth 2 (two) times daily as needed.   montelukast (SINGULAIR) 10 MG tablet Take 10 mg by mouth at bedtime. (Patient not taking: Reported on 11/03/2021)   pantoprazole (PROTONIX) 40 MG tablet Take 40 mg by mouth 2 (two) times daily. (Patient not taking: Reported on 11/03/2021)   tamsulosin (FLOMAX) 0.4 MG CAPS capsule Take 0.4 mg by mouth daily. (Patient not taking: Reported on 4/74/2595)   TRULICITY 3 GL/8.7FI SOPN Inject 3 mg into the skin once a week.   [DISCONTINUED] Continuous Blood Gluc Sensor (FREESTYLE LIBRE 2 SENSOR) MISC 1 Piece by Does not apply route every 14 (fourteen) days.   [DISCONTINUED] SOLIQUA 100-33 UNT-MCG/ML SOPN Inject 60 Units into the skin daily.   No facility-administered encounter medications on file as of 11/16/2021.    ALLERGIES: No Known Allergies  VACCINATION STATUS:  There is no immunization history on file for this patient.  Diabetes He presents for his follow-up diabetic visit. He has type 2 diabetes mellitus. Onset time: He was diagnosed at approximate age of 7 years. His  disease course has been improving. There are no hypoglycemic associated symptoms. Pertinent negatives for hypoglycemia include no confusion, headaches, pallor or seizures. Associated symptoms include blurred vision, polydipsia and polyuria. Pertinent negatives for diabetes include no chest pain, no fatigue, no polyphagia and no weakness. There are no hypoglycemic complications. Symptoms are improving. Diabetic complications include peripheral neuropathy. Risk factors for coronary artery disease include family history, dyslipidemia, male sex, obesity, sedentary lifestyle and diabetes mellitus. Current diabetic treatments: He is currently on glimepiride 4 mg p.o. twice daily, Soliqua 60 units daily, Trulicity 3.0 mg weekly. His weight is stable. He is following a generally unhealthy diet. When asked about meal planning, he reported none. He has not had a previous visit with a dietitian. He rarely participates in exercise. His home blood glucose trend is decreasing steadily. His breakfast blood glucose range is generally 180-200 mg/dl. His lunch blood glucose range is generally >200 mg/dl. His dinner blood glucose range is generally >200 mg/dl. His bedtime blood glucose range is generally >200 mg/dl. His overall blood glucose range is >200 mg/dl. (He brings in his CGM  showing 26 % TIR, 44 % Level 1 hyper, 29% Level 2 hyperglycemia.  His recent a1c was 10.9%. He denies hypoglycemia.) An ACE inhibitor/angiotensin II receptor blocker is being taken. Eye exam is current.  Hyperlipidemia This is a chronic problem. The problem is uncontrolled. Pertinent negatives include no chest pain, myalgias or shortness of breath. Risk factors for coronary artery disease include dyslipidemia, diabetes mellitus, family history, male sex, hypertension, a sedentary lifestyle and obesity.  Hypertension This is a chronic problem. The current episode started more than 1 year ago. The problem is controlled. Associated symptoms include  blurred vision. Pertinent negatives include no chest pain, headaches, neck pain, palpitations or shortness of breath. Risk factors for coronary artery disease include diabetes mellitus, dyslipidemia, family history, obesity, male gender and sedentary lifestyle. Past treatments include ACE inhibitors. The current treatment provides moderate improvement.     Review of Systems  Constitutional:  Negative for chills, fatigue, fever and unexpected weight change.  HENT:  Negative for dental problem, mouth sores and trouble swallowing.   Eyes:  Positive for blurred vision. Negative for visual disturbance.  Respiratory:  Negative for cough, choking, chest tightness, shortness of breath and wheezing.   Cardiovascular:  Negative for chest pain, palpitations and leg swelling.  Gastrointestinal:  Negative for abdominal distention, abdominal pain, constipation,  diarrhea, nausea and vomiting.  Endocrine: Positive for polydipsia and polyuria. Negative for polyphagia.  Genitourinary:  Negative for dysuria, flank pain, hematuria and urgency.  Musculoskeletal:  Negative for back pain, gait problem, myalgias and neck pain.  Skin:  Negative for pallor, rash and wound.  Neurological:  Negative for seizures, syncope, weakness, numbness and headaches.  Psychiatric/Behavioral: Negative.  Negative for confusion and dysphoric mood.     Objective:       11/16/2021    8:28 AM 11/03/2021    8:13 AM  Vitals with BMI  Height $Remov'6\' 0"'FETVkV$  $Remove'6\' 0"'hEIRaux$   Weight 286 lbs 3 oz 286 lbs 6 oz  BMI 72.90 21.11  Systolic 552 080  Diastolic 84 70  Pulse 76 88    BP 136/84   Pulse 76   Ht 6' (1.829 m)   Wt 286 lb 3.2 oz (129.8 kg)   BMI 38.82 kg/m   Wt Readings from Last 3 Encounters:  11/16/21 286 lb 3.2 oz (129.8 kg)  11/03/21 286 lb 6.4 oz (129.9 kg)      CMP ( most recent) CMP     Component Value Date/Time   NA 141 10/09/2021 0000   K 4.1 10/09/2021 0000   CL 106 10/09/2021 0000   CO2 21 10/09/2021 0000   BUN 23 (A)  10/09/2021 0000   CREATININE 0.9 10/09/2021 0000   CALCIUM 9.5 10/09/2021 0000   ALBUMIN 4.2 10/09/2021 0000   AST 15 10/09/2021 0000   ALT 23 10/09/2021 0000   ALKPHOS 73 10/09/2021 0000     Diabetic Labs (most recent): Lab Results  Component Value Date   HGBA1C 10.0 10/09/2021     Lipid Panel ( most recent) Lipid Panel     Component Value Date/Time   CHOL 127 10/12/2021 0000   TRIG 254 (A) 10/12/2021 0000   HDL 26 (A) 10/12/2021 0000   LDLCALC 60 10/12/2021 0000      Lab Results  Component Value Date   TSH 0.99 10/09/2021      Assessment & Plan:   1. Type 2 diabetes mellitus with hyperglycemia, with long-term current use of insulin ([place])  - Kevin Strong has currently uncontrolled symptomatic type 2 DM since  56 years of age.  He brings in his CGM  showing 26 % TIR, 44 % Level 1 hyper, 29% Level 2 hyperglycemia.  His recent a1c was 10.9%. He denies hypoglycemia.   Recent labs reviewed. - I had a long discussion with him about the progressive nature of diabetes and the pathology behind its complications. -his diabetes is complicated by obesity/sedentary life, comorbid hypertension/ hyperlipidemia , polypharmacy, sleep apnea and he remains at a high risk for more acute and chronic complications which include CAD, CVA, CKD, retinopathy, and neuropathy. These are all discussed in detail with him.  - I discussed all available options of managing his diabetes including de-escalation of medications. I have counseled him on diet  and weight management  by adopting a Whole Food , Plant Predominant  ( WFPP) nutrition as recommended by [employer] of Lifestyle Medicine. Patient is encouraged to switch to  unprocessed or minimally processed  complex starch, adequate protein intake (mainly plant source), minimal liquid fat ( mainly vegetable oils), plenty of fruits, and vegetables. -  he is advised to stick to a routine mealtimes to eat 3 complete meals a day and snack  only when necessary ( to snack only to correct hypoglycemia BG <70 day time or <100 at night).   -  he acknowledges that there is a room for improvement in his food and drink choices. - Suggestion is made for him to avoid simple carbohydrates  from his diet including Cakes, Sweet Desserts, Ice Cream, Soda (diet and regular), Sweet Tea, Candies, Chips, Cookies, Store Bought Juices, Alcohol , Artificial Sweeteners,  Coffee Creamer, and "Sugar-free" Products, Lemonade. This will help patient to have more stable blood glucose profile and potentially avoid unintended weight gain.  The following Lifestyle Medicine recommendations according to [place]) were discussed and and offered to patient and he  agrees to start the journey:  A. Whole Foods, Plant-Based Nutrition comprising of fruits and vegetables, plant-based proteins, whole-grain carbohydrates was discussed in detail with the patient.   A list for source of those nutrients were also provided to the patient.  Patient will use only water or unsweetened tea for hydration. B.  The need to stay away from risky substances including alcohol, smoking; obtaining 7 to 9 hours of restorative sleep, at least 150 minutes of moderate intensity exercise weekly, the importance of healthy social connections,  and stress management techniques were discussed. C.  A full color page of  Calorie density of various food groups per pound showing examples of each food groups was provided to the patient.   - he will be scheduled with Jearld Fenton, RDN, CDE for individualized diabetes education.  - I have approached him with the following plan to manage  his diabetes and patient agrees:   -This patient will benefit from de-escalation of medications especially if he is engaging with lifestyle medicine.   Accordingly, he is advised to continue  Soliqua 60 units daily at breakfast.  His Trulicity and [place] carry  a risk of double  exposure to GLP-1 receptor agonists.  His Willeen Niece will be discontinued and replaced with Lantus as a basal insulin  when he finishes his current supplies of [place].    He is advised to continue  glipizide 5 mg XL p.o. daily at breakfast. He is encouraged to use his CGM, discussed and prescribed the freestyle libre device for him.  If he presents with significant postprandial hyperglycemia burden, he will be considered for rapid acting insulin during meals. - he is warned not to take insulin without proper monitoring per orders.  - he is encouraged to call clinic for blood glucose levels less than 70 or above 200 mg /dl.  - Specific targets for  A1c;  LDL, HDL,  and Triglycerides were discussed with the patient.  2) Blood Pressure /Hypertension:  His BP is controlled to target.   he is advised to continue his current medications including lisinopril 2.5 mg p.o. daily with breakfast . 3) Lipids/Hyperlipidemia:   Review of his recent lipid panel showed un controlled on the right side/254, LDL at 60.    he  is advised to continue    atorvastatin 40 mg daily at bedtime.  Side effects and precautions discussed with him.  Food plant-based diet will help him address his triglyceridemia.   4)  Weight/Diet:  Body mass index is 38.82 kg/m.  -   clearly complicating his diabetes care.   he is  a candidate for weight loss. I discussed with him the fact that loss of 5 - 10% of his  current body weight will have the most impact on his diabetes management.  The above detailed  ACLM recommendations for nutrition, exercise, sleep, social life, avoidance of risky substances, the need for restorative  sleep   information will also detailed on discharge instructions.  5) Chronic Care/Health Maintenance:  -he  is on ACEI/ARB and Statin medications and  is encouraged to initiate and continue to follow up with Ophthalmology, Dentist,  Podiatrist at least yearly or according to recommendations, and advised to   stay away  from smoking. I have recommended yearly flu vaccine and pneumonia vaccine at least every 5 years; moderate intensity exercise for up to 150 minutes weekly; and  sleep for 7- 9 hours a day.  - he is  advised to maintain close follow up with Redmond School, MD for primary care needs, as well as his other providers for optimal and coordinated care.   I spent 42 minutes in the care of the patient today including review of labs from [facility], Lipids, Thyroid Function, Hematology (current and previous including abstractions from other facilities); face-to-face time discussing  his blood glucose readings/logs, discussing hypoglycemia and hyperglycemia episodes and symptoms, medications doses, his options of short and long term treatment based on the latest standards of care / guidelines;  discussion about incorporating lifestyle medicine;  and documenting the encounter. Risk reduction counseling performed per USPSTF guidelines to reduce obesity and cardiovascular risk factors.     Please refer to Patient Instructions for Blood Glucose Monitoring and Insulin/Medications Dosing Guide"  in media tab for additional information. Please  also refer to " Patient Self Inventory" in the Media  tab for reviewed elements of pertinent patient history.  Patricia Nettle participated in the discussions, expressed understanding, and voiced agreement with the above plans.  All questions were answered to his satisfaction. he is encouraged to contact clinic should he have any questions or concerns prior to his return visit.   Follow up plan: - Return in about 9 weeks (around 01/18/2022) for Bring Meter/CGM Device/Logs- A1c in Office.  Glade Lloyd, MD [facility] [address] [place],  [address] Phone: [phone]  Fax: [phone]    11/16/2021, 1:42 PM  This note was partially dictated with voice recognition software. Similar sounding words can be transcribed  inadequately or may not  be corrected upon review.

## 2021-11-16 NOTE — Patient Instructions (Signed)

## 2021-12-22 ENCOUNTER — Ambulatory Visit: Payer: 59 | Admitting: Nutrition

## 2021-12-22 ENCOUNTER — Telehealth: Payer: Self-pay | Admitting: Nutrition

## 2021-12-22 NOTE — Telephone Encounter (Signed)
Vm left to call and  r/s missed appt. 

## 2022-01-25 ENCOUNTER — Ambulatory Visit: Payer: 59 | Admitting: "Endocrinology

## 2022-03-29 IMAGING — DX DG TOE 2ND 2+V*R*
3 series · 3 of 3 positions shown · non-contrast
Comparison: None.

CLINICAL DATA: Second toe cellulitis, evaluate for possible bony
destruction

EXAM:
RIGHT SECOND TOE

[toe ap]
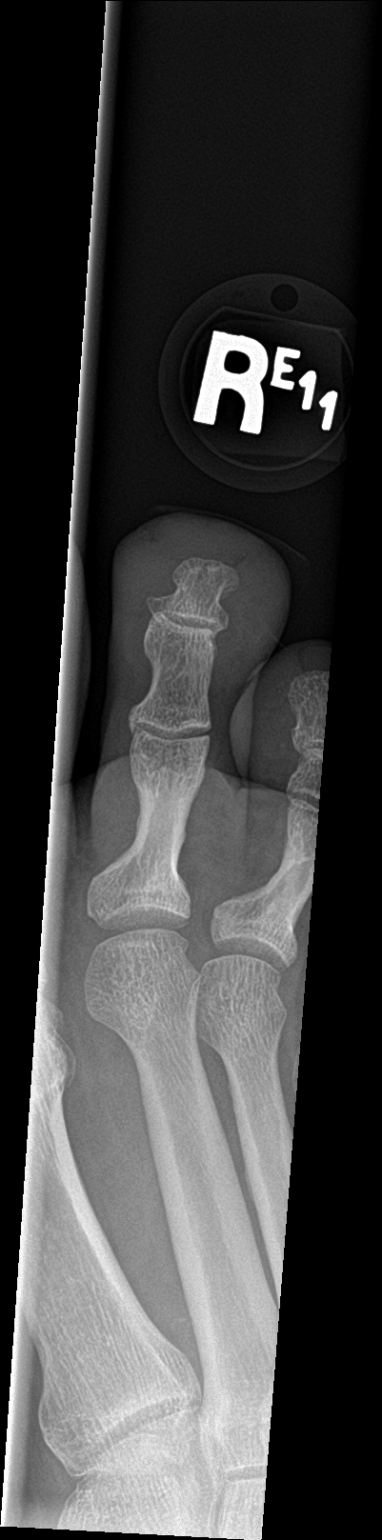

[toe obl]
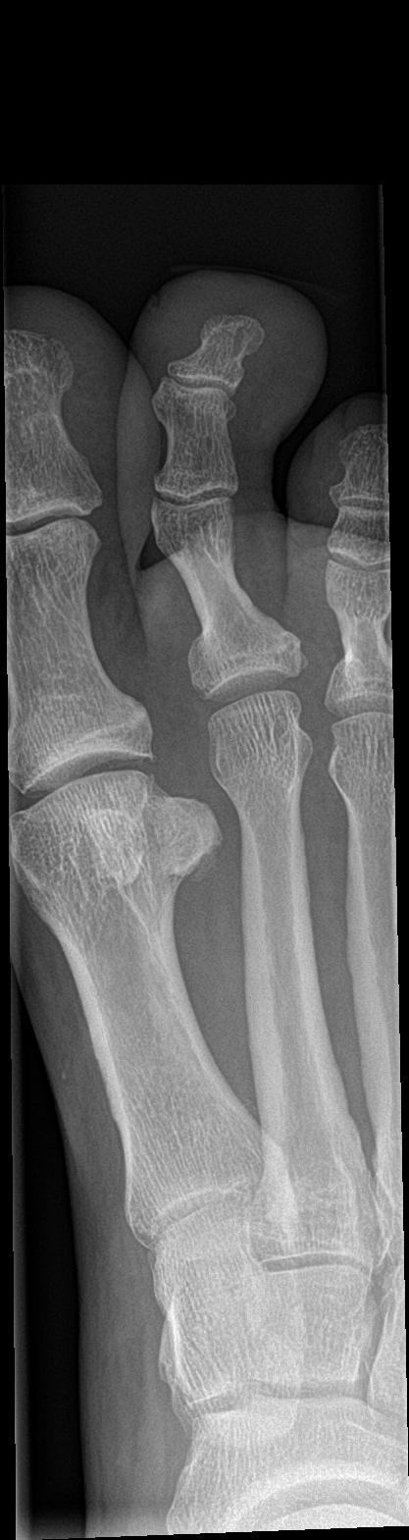

[toe lat]
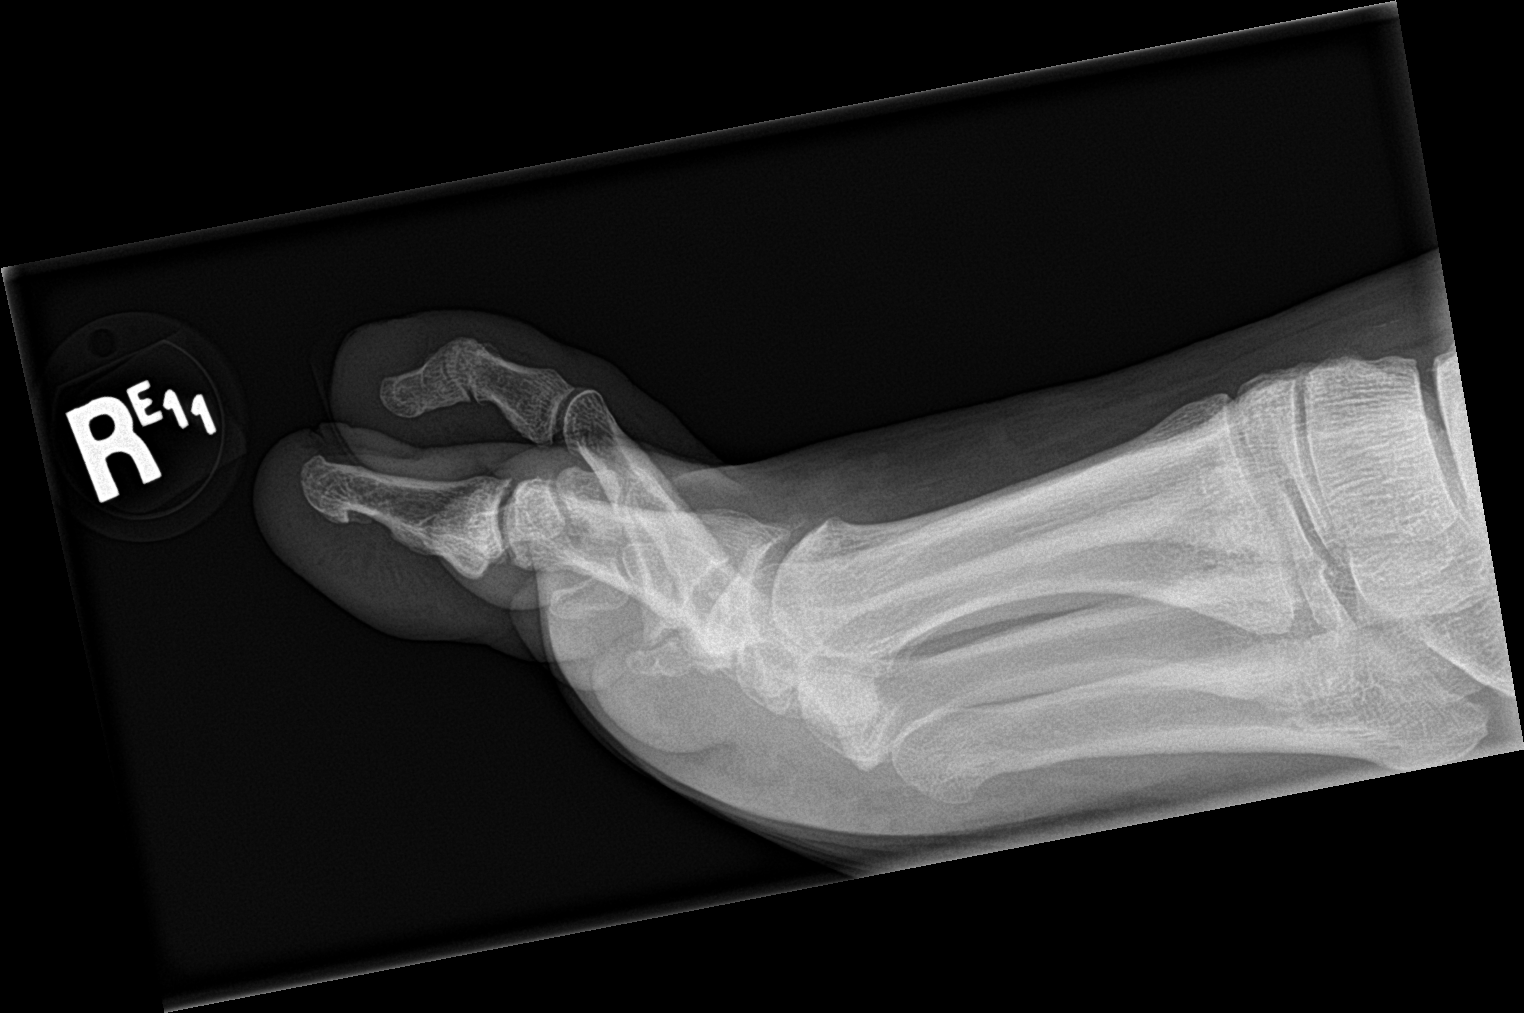

[3 of 3 positions shown; findings below may reference images not displayed]

FINDINGS: No acute fracture or dislocation is noted. Soft tissue swelling in
the second digit is seen. No bony erosive changes to suggest
osteomyelitis are noted at this time.
IMPRESSION: Soft tissue swelling without bony abnormality.

## 2022-04-29 ENCOUNTER — Other Ambulatory Visit: Payer: Self-pay | Admitting: "Endocrinology

## 2022-05-19 ENCOUNTER — Other Ambulatory Visit (HOSPITAL_COMMUNITY): Payer: Self-pay | Admitting: Internal Medicine

## 2022-05-19 DIAGNOSIS — R1310 Dysphagia, unspecified: Secondary | ICD-10-CM

## 2022-05-27 ENCOUNTER — Encounter (HOSPITAL_COMMUNITY): Payer: Self-pay

## 2022-05-27 ENCOUNTER — Ambulatory Visit (HOSPITAL_COMMUNITY): Payer: Self-pay

## 2022-11-26 ENCOUNTER — Other Ambulatory Visit: Payer: Self-pay

## 2022-11-26 ENCOUNTER — Encounter (HOSPITAL_COMMUNITY): Payer: Self-pay

## 2022-11-26 ENCOUNTER — Emergency Department (HOSPITAL_COMMUNITY)
Admission: EM | Admit: 2022-11-26 | Discharge: 2022-11-27 | Disposition: A | Discharge status: Home / Self Care | Payer: 59 | Attending: Emergency Medicine | Admitting: Emergency Medicine

## 2022-11-26 DIAGNOSIS — Z7982 Long term (current) use of aspirin: Secondary | ICD-10-CM | POA: Insufficient documentation

## 2022-11-26 DIAGNOSIS — Z794 Long term (current) use of insulin: Secondary | ICD-10-CM | POA: Insufficient documentation

## 2022-11-26 DIAGNOSIS — L03031 Cellulitis of right toe: Secondary | ICD-10-CM | POA: Diagnosis not present

## 2022-11-26 DIAGNOSIS — E10621 Type 1 diabetes mellitus with foot ulcer: Secondary | ICD-10-CM | POA: Diagnosis present

## 2022-11-26 DIAGNOSIS — D649 Anemia, unspecified: Secondary | ICD-10-CM | POA: Diagnosis not present

## 2022-11-26 DIAGNOSIS — E871 Hypo-osmolality and hyponatremia: Secondary | ICD-10-CM | POA: Diagnosis not present

## 2022-11-26 DIAGNOSIS — L97511 Non-pressure chronic ulcer of other part of right foot limited to breakdown of skin: Secondary | ICD-10-CM | POA: Diagnosis not present

## 2022-11-26 LAB — CBC WITH DIFFERENTIAL/PLATELET
Abs Immature Granulocytes: 0.01 10*3/uL (ref 0.00–0.07)
Basophils Absolute: 0.1 10*3/uL (ref 0.0–0.1)
Basophils Relative: 1 %
Eosinophils Absolute: 0.3 10*3/uL (ref 0.0–0.5)
Eosinophils Relative: 4 %
HCT: 34 % — ABNORMAL LOW (ref 39.0–52.0)
Hemoglobin: 10.8 g/dL — ABNORMAL LOW (ref 13.0–17.0)
Immature Granulocytes: 0 %
Lymphocytes Relative: 36 %
Lymphs Abs: 2.6 10*3/uL (ref 0.7–4.0)
MCH: 27.2 pg (ref 26.0–34.0)
MCHC: 31.8 g/dL (ref 30.0–36.0)
MCV: 85.6 fL (ref 80.0–100.0)
Monocytes Absolute: 0.6 10*3/uL (ref 0.1–1.0)
Monocytes Relative: 8 %
Neutro Abs: 3.9 10*3/uL (ref 1.7–7.7)
Neutrophils Relative %: 51 %
Platelets: 308 10*3/uL (ref 150–400)
RBC: 3.97 MIL/uL — ABNORMAL LOW (ref 4.22–5.81)
RDW: 13.3 % (ref 11.5–15.5)
WBC: 7.4 10*3/uL (ref 4.0–10.5)
nRBC: 0 % (ref 0.0–0.2)

## 2022-11-26 LAB — COMPREHENSIVE METABOLIC PANEL
ALT: 18 U/L (ref 0–44)
AST: 14 U/L — ABNORMAL LOW (ref 15–41)
Albumin: 3.3 g/dL — ABNORMAL LOW (ref 3.5–5.0)
Alkaline Phosphatase: 78 U/L (ref 38–126)
Anion gap: 9 (ref 5–15)
BUN: 24 mg/dL — ABNORMAL HIGH (ref 6–20)
CO2: 20 mmol/L — ABNORMAL LOW (ref 22–32)
Calcium: 8.1 mg/dL — ABNORMAL LOW (ref 8.9–10.3)
Chloride: 104 mmol/L (ref 98–111)
Creatinine, Ser: 0.99 mg/dL (ref 0.61–1.24)
GFR, Estimated: >60 mL/min (ref >60)
Glucose, Bld: 405 mg/dL — ABNORMAL HIGH (ref 70–99)
Potassium: 3.6 mmol/L (ref 3.5–5.1)
Sodium: 133 mmol/L — ABNORMAL LOW (ref 135–145)
Total Bilirubin: 0.7 mg/dL (ref 0.3–1.2)
Total Protein: 6.5 g/dL (ref 6.5–8.1)

## 2022-11-26 LAB — CBG MONITORING, ED: Glucose-Capillary: 385 mg/dL — ABNORMAL HIGH (ref 70–99)

## 2022-11-26 NOTE — ED Triage Notes (Signed)
Pt reports he was sent to the ER by PCP for a toe infection with elevated blood sugar.

## 2022-11-27 ENCOUNTER — Emergency Department (HOSPITAL_COMMUNITY): Payer: 59

## 2022-11-27 LAB — CBG MONITORING, ED
Glucose-Capillary: 321 mg/dL — ABNORMAL HIGH (ref 70–99)
Glucose-Capillary: 262 mg/dL — ABNORMAL HIGH (ref 70–99)
Glucose-Capillary: 275 mg/dL — ABNORMAL HIGH (ref 70–99)

## 2022-11-27 LAB — C-REACTIVE PROTEIN: CRP: 1 mg/dL — ABNORMAL HIGH (ref <1.0)

## 2022-11-27 LAB — SEDIMENTATION RATE: Sed Rate: 26 mm/hr — ABNORMAL HIGH (ref 0–16)

## 2022-11-27 MED ORDER — INSULIN ASPART 100 UNIT/ML IV SOLN
10.0000 [IU] | Freq: Once | INTRAVENOUS | Status: AC
  Administered 2022-11-27: 10 [IU] via INTRAVENOUS

## 2022-11-27 MED ORDER — DOXYCYCLINE HYCLATE 100 MG PO CAPS: 100.0000 mg | ORAL_CAPSULE | Freq: Two times a day (BID) | ORAL | 0 refills | Status: DC

## 2022-11-27 MED ORDER — DOXYCYCLINE HYCLATE 100 MG PO TABS
100.0000 mg | ORAL_TABLET | Freq: Once | ORAL | Status: AC
  Administered 2022-11-27: 100 mg via ORAL
  Filled 2022-11-27: qty 1

## 2022-11-27 MED ORDER — IOHEXOL 300 MG/ML  SOLN
100.0000 mL | Freq: Once | INTRAMUSCULAR | Status: AC | PRN
  Administered 2022-11-27: 100 mL via INTRAVENOUS

## 2022-11-27 NOTE — Discharge Instructions (Signed)
Keep the ulcer clean and dry.  Return to the emergency department if redness or swelling seem to be getting worse, or if you see red streaks going up your leg, or if you start running a fever.  Talk with your primary care provider about possible referral to either podiatry or wound management.

## 2022-11-27 NOTE — ED Provider Notes (Signed)
[place] EMERGENCY DEPARTMENT AT [facility] Provider Note   CSN: [id number] Arrival date & time: 11/26/22  1828     History  Chief Complaint  Patient presents with   Wound Infection    Kevin Strong is a 57 y.o. male.  The history is provided by the patient.  He has a history of diabetes, hyperlipidemia and comes in because of an infection of his right second toe.  He noticed it about 2 weeks ago.  There was initially some drainage.  It is painful.  He denies fever or chills.  He states that his blood sugar has been going up and down and there were days when it was over 600.  He has been working with his physicians to adjust his medications for diabetes.  His primary care provider told him to come in for evaluation of the lesion on the toe.   Home Medications Prior to Admission medications   Medication Sig Start Date End Date Taking? Authorizing Provider  aspirin EC 81 MG tablet Take 81 mg by mouth daily. Swallow whole.    [provider]  atorvastatin (LIPITOR) 40 MG tablet Take 40 mg by mouth daily. 10/02/21   [provider]  Blood Glucose Monitoring Suppl (ACCU-CHEK GUIDE ME) w/Device KIT 1 Piece by Does not apply route as directed. 11/03/21   Roma Kayser, MD  Continuous Blood Gluc Receiver (FREESTYLE LIBRE 2 READER) DEVI As directed 11/03/21   Roma Kayser, MD  Continuous Blood Gluc Sensor (FREESTYLE LIBRE 3 SENSOR) MISC 1 Piece by Does not apply route every 14 (fourteen) days. Place 1 sensor on the skin every 14 days. Use to check glucose continuously 11/16/21   Roma Kayser, MD  furosemide (LASIX) 20 MG tablet Take 20 mg by mouth daily as needed. 10/02/21   [provider]  gabapentin (NEURONTIN) 600 MG tablet Take 1-4 tablets by mouth 4 (four) times daily. 07/03/21   [provider]  glipiZIDE (GLUCOTROL XL) 5 MG 24 hr tablet Take 1 tablet (5 mg total) by mouth daily with breakfast. 11/03/21   Nida,  Denman George, MD  glucose blood (ACCU-CHEK GUIDE) test strip Use as instructed 11/03/21   Roma Kayser, MD  Insulin Glargine-Lixisenatide 100-33 UNT-MCG/ML SOPN Inject 60 Units into the skin daily with breakfast.    [provider]  lidocaine (XYLOCAINE) 5 % ointment Apply 1 Application topically 4 (four) times daily as needed.    [provider]  lisinopril (ZESTRIL) 2.5 MG tablet Take 2.5 mg by mouth daily. 10/27/21   [provider]  meloxicam (MOBIC) 15 MG tablet Take 15 mg by mouth daily. 10/05/21   [provider]  meloxicam (MOBIC) 7.5 MG tablet Take 7.5 mg by mouth 2 (two) times daily as needed. 05/28/21   [provider]  montelukast (SINGULAIR) 10 MG tablet Take 10 mg by mouth at bedtime. Patient not taking: Reported on 11/03/2021    [provider]  pantoprazole (PROTONIX) 40 MG tablet Take 40 mg by mouth 2 (two) times daily. Patient not taking: Reported on 11/03/2021    [provider]  tamsulosin (FLOMAX) 0.4 MG CAPS capsule Take 0.4 mg by mouth daily. Patient not taking: Reported on 11/03/2021 10/02/21   [provider]  TRULICITY 3 MG/0.5ML SOPN Inject 3 mg into the skin once a week. 10/27/21   [provider]      Allergies    Patient has no known allergies.  Review of Systems   Review of Systems  All other systems reviewed and are negative.   Physical Exam Updated Vital Signs BP 124/75   Pulse 64   Temp (!) 97.5 F (36.4 C) (Oral)   Resp 12   Ht 6' (1.829 m)   Wt 116.6 kg   SpO2 98%   BMI 34.86 kg/m  Physical Exam Vitals and nursing note reviewed.   57 year old male, resting comfortably and in no acute distress. Vital signs are normal. Oxygen saturation is 98%, which is normal. Head is normocephalic and atraumatic. PERRLA, EOMI. Oropharynx is clear. Neck is nontender and supple without adenopathy . Lungs are clear without rales, wheezes, or rhonchi. Chest is nontender. Heart  has regular rate and rhythm without murmur. Abdomen is soft, flat, nontender. Extremities: There is erythema and mild soft tissue swelling of the right second toe, ulcer on the pad of the toe which seems to go down into the subcutaneous fat, but not to bone.  There is no drainage seen.  There are no lymphangitic streaks. Skin is warm and dry without rash. Neurologic: Mental status is normal, cranial nerves are intact, there are no motor or sensory deficits.  Specifically, no evidence of peripheral neuropathy.  ED Results / Procedures / Treatments   Labs (all labs ordered are listed, but only abnormal results are displayed) Labs Reviewed  CBC WITH DIFFERENTIAL/PLATELET - Abnormal; Notable for the following components:      Result Value   RBC 3.97 (*)    Hemoglobin 10.8 (*)    HCT 34.0 (*)    All other components within normal limits  COMPREHENSIVE METABOLIC PANEL - Abnormal; Notable for the following components:   Sodium 133 (*)    CO2 20 (*)    Glucose, Bld 405 (*)    BUN 24 (*)    Calcium 8.1 (*)    Albumin 3.3 (*)    AST 14 (*)    All other components within normal limits  CBG MONITORING, ED - Abnormal; Notable for the following components:   Glucose-Capillary 385 (*)    All other components within normal limits    EKG None  Radiology No results found.  Procedures Procedures  Cardiac monitor shows normal sinus rhythm, per my interpretation.  Medications Ordered in ED Medications  insulin aspart (novoLOG) injection 10 Units (10 Units Intravenous Given 11/27/22 0155)  iohexol (OMNIPAQUE) 300 MG/ML solution 100 mL (100 mLs Intravenous Contrast Given 11/27/22 0216)  doxycycline (VIBRA-TABS) tablet 100 mg (100 mg Oral Given 11/27/22 0501)    ED Course/ Medical Decision Making/ A&P                                 Medical Decision Making Amount and/or Complexity of Data Reviewed Labs: ordered. Radiology: ordered.  Risk OTC drugs. Prescription drug management.   Ulcer  of the right second toe with cellulitis, rule out osteomyelitis.  I have reviewed his laboratory tests and my interpretation is normal WBC, mild anemia with no prior labs available for comparison, mild hyponatremia which is not felt to be clinically significant, mild hyperglycemia without evidence of ketoacidosis.  I have ordered additional laboratory work of CRP and sedimentation rate and I have ordered CT of the foot with contrast to look for signs of osteomyelitis.  I have ordered a dose of IV insulin for his hyperglycemia.  Glucose has come down to a reasonable level following insulin.  CT scan shows no evidence of osteomyelitis.  I have independently viewed the images, and agree with radiologist's interpretation.  Sedimentation rate is minimally elevated at 26, CRP is pending but with negative CT scan and essentially normal sedimentation rate and normal WBC, I do not feel he needs inpatient management.  I ordered a dose of doxycycline and I am discharging him with a prescription for doxycycline.  I am recommending follow-up with his primary care provider in 1 week, may need referral to either wound management or podiatry.  Return precautions discussed.  Final Clinical Impression(s) / ED Diagnoses Final diagnoses:  Diabetic ulcer of toe of right foot associated with type 1 diabetes mellitus, limited to breakdown of skin (HCC)  Cellulitis of toe of right foot  Hyponatremia  Normochromic normocytic anemia    Rx / DC Orders ED Discharge Orders          Ordered    doxycycline (VIBRAMYCIN) 100 MG capsule  2 times daily        11/27/22 0520              Dione Booze, MD 11/27/22 [phone]

## 2023-02-24 ENCOUNTER — Emergency Department (HOSPITAL_COMMUNITY): Payer: 59

## 2023-02-24 ENCOUNTER — Inpatient Hospital Stay (HOSPITAL_COMMUNITY)
Admission: EM | Admit: 2023-02-24 | Discharge: 2023-03-01 | DRG: 853 | Disposition: A | Discharge status: Home / Self Care | Payer: 59 | Attending: Internal Medicine | Admitting: Internal Medicine

## 2023-02-24 ENCOUNTER — Encounter (HOSPITAL_COMMUNITY): Payer: Self-pay

## 2023-02-24 ENCOUNTER — Other Ambulatory Visit: Payer: Self-pay

## 2023-02-24 DIAGNOSIS — E1152 Type 2 diabetes mellitus with diabetic peripheral angiopathy with gangrene: Secondary | ICD-10-CM | POA: Diagnosis present

## 2023-02-24 DIAGNOSIS — E1165 Type 2 diabetes mellitus with hyperglycemia: Secondary | ICD-10-CM | POA: Diagnosis present

## 2023-02-24 DIAGNOSIS — M869 Osteomyelitis, unspecified: Secondary | ICD-10-CM

## 2023-02-24 DIAGNOSIS — I10 Essential (primary) hypertension: Secondary | ICD-10-CM | POA: Diagnosis present

## 2023-02-24 DIAGNOSIS — E872 Acidosis, unspecified: Secondary | ICD-10-CM | POA: Diagnosis present

## 2023-02-24 DIAGNOSIS — L0390 Cellulitis, unspecified: Secondary | ICD-10-CM

## 2023-02-24 DIAGNOSIS — Z83438 Family history of other disorder of lipoprotein metabolism and other lipidemia: Secondary | ICD-10-CM

## 2023-02-24 DIAGNOSIS — E871 Hypo-osmolality and hyponatremia: Secondary | ICD-10-CM

## 2023-02-24 DIAGNOSIS — M86171 Other acute osteomyelitis, right ankle and foot: Secondary | ICD-10-CM | POA: Diagnosis present

## 2023-02-24 DIAGNOSIS — D509 Iron deficiency anemia, unspecified: Secondary | ICD-10-CM | POA: Diagnosis present

## 2023-02-24 DIAGNOSIS — Z713 Dietary counseling and surveillance: Secondary | ICD-10-CM

## 2023-02-24 DIAGNOSIS — R739 Hyperglycemia, unspecified: Secondary | ICD-10-CM | POA: Diagnosis present

## 2023-02-24 DIAGNOSIS — Z794 Long term (current) use of insulin: Secondary | ICD-10-CM | POA: Diagnosis not present

## 2023-02-24 DIAGNOSIS — K449 Diaphragmatic hernia without obstruction or gangrene: Secondary | ICD-10-CM | POA: Diagnosis present

## 2023-02-24 DIAGNOSIS — K264 Chronic or unspecified duodenal ulcer with hemorrhage: Secondary | ICD-10-CM | POA: Diagnosis present

## 2023-02-24 DIAGNOSIS — K921 Melena: Secondary | ICD-10-CM | POA: Diagnosis not present

## 2023-02-24 DIAGNOSIS — Z7982 Long term (current) use of aspirin: Secondary | ICD-10-CM

## 2023-02-24 DIAGNOSIS — E785 Hyperlipidemia, unspecified: Secondary | ICD-10-CM | POA: Diagnosis present

## 2023-02-24 DIAGNOSIS — K5900 Constipation, unspecified: Secondary | ICD-10-CM | POA: Diagnosis present

## 2023-02-24 DIAGNOSIS — E1169 Type 2 diabetes mellitus with other specified complication: Secondary | ICD-10-CM | POA: Diagnosis present

## 2023-02-24 DIAGNOSIS — A419 Sepsis, unspecified organism: Secondary | ICD-10-CM | POA: Diagnosis present

## 2023-02-24 DIAGNOSIS — Z6835 Body mass index (BMI) 35.0-35.9, adult: Secondary | ICD-10-CM

## 2023-02-24 DIAGNOSIS — E66812 Obesity, class 2: Secondary | ICD-10-CM | POA: Diagnosis present

## 2023-02-24 DIAGNOSIS — Z6834 Body mass index (BMI) 34.0-34.9, adult: Secondary | ICD-10-CM

## 2023-02-24 DIAGNOSIS — Z791 Long term (current) use of non-steroidal anti-inflammatories (NSAID): Secondary | ICD-10-CM

## 2023-02-24 DIAGNOSIS — K269 Duodenal ulcer, unspecified as acute or chronic, without hemorrhage or perforation: Secondary | ICD-10-CM | POA: Diagnosis not present

## 2023-02-24 DIAGNOSIS — D75839 Thrombocytosis, unspecified: Secondary | ICD-10-CM | POA: Diagnosis present

## 2023-02-24 DIAGNOSIS — K222 Esophageal obstruction: Secondary | ICD-10-CM | POA: Diagnosis present

## 2023-02-24 DIAGNOSIS — Z7985 Long-term (current) use of injectable non-insulin antidiabetic drugs: Secondary | ICD-10-CM

## 2023-02-24 DIAGNOSIS — D649 Anemia, unspecified: Secondary | ICD-10-CM | POA: Diagnosis not present

## 2023-02-24 DIAGNOSIS — L03031 Cellulitis of right toe: Secondary | ICD-10-CM | POA: Diagnosis present

## 2023-02-24 DIAGNOSIS — Z79899 Other long term (current) drug therapy: Secondary | ICD-10-CM

## 2023-02-24 DIAGNOSIS — E1140 Type 2 diabetes mellitus with diabetic neuropathy, unspecified: Secondary | ICD-10-CM | POA: Diagnosis present

## 2023-02-24 DIAGNOSIS — Z7984 Long term (current) use of oral hypoglycemic drugs: Secondary | ICD-10-CM

## 2023-02-24 DIAGNOSIS — E876 Hypokalemia: Secondary | ICD-10-CM | POA: Diagnosis present

## 2023-02-24 DIAGNOSIS — Z8249 Family history of ischemic heart disease and other diseases of the circulatory system: Secondary | ICD-10-CM

## 2023-02-24 DIAGNOSIS — I96 Gangrene, not elsewhere classified: Secondary | ICD-10-CM | POA: Diagnosis present

## 2023-02-24 DIAGNOSIS — K3189 Other diseases of stomach and duodenum: Secondary | ICD-10-CM | POA: Diagnosis not present

## 2023-02-24 DIAGNOSIS — Z1152 Encounter for screening for COVID-19: Secondary | ICD-10-CM | POA: Diagnosis not present

## 2023-02-24 DIAGNOSIS — Z833 Family history of diabetes mellitus: Secondary | ICD-10-CM

## 2023-02-24 DIAGNOSIS — K219 Gastro-esophageal reflux disease without esophagitis: Secondary | ICD-10-CM | POA: Insufficient documentation

## 2023-02-24 LAB — COMPREHENSIVE METABOLIC PANEL
ALT: 13 U/L (ref 0–44)
AST: 13 U/L — ABNORMAL LOW (ref 15–41)
Albumin: 3.6 g/dL (ref 3.5–5.0)
Alkaline Phosphatase: 74 U/L (ref 38–126)
Anion gap: 13 (ref 5–15)
BUN: 19 mg/dL (ref 6–20)
CO2: 20 mmol/L — ABNORMAL LOW (ref 22–32)
Calcium: 8.9 mg/dL (ref 8.9–10.3)
Chloride: 101 mmol/L (ref 98–111)
Creatinine, Ser: 1 mg/dL (ref 0.61–1.24)
GFR, Estimated: >60 mL/min (ref >60)
Glucose, Bld: 432 mg/dL — ABNORMAL HIGH (ref 70–99)
Potassium: 3.9 mmol/L (ref 3.5–5.1)
Sodium: 134 mmol/L — ABNORMAL LOW (ref 135–145)
Total Bilirubin: 0.2 mg/dL (ref <1.2)
Total Protein: 7.4 g/dL (ref 6.5–8.1)

## 2023-02-24 LAB — CBG MONITORING, ED
Glucose-Capillary: 294 mg/dL — ABNORMAL HIGH (ref 70–99)
Glucose-Capillary: 261 mg/dL — ABNORMAL HIGH (ref 70–99)

## 2023-02-24 LAB — BRAIN NATRIURETIC PEPTIDE: B Natriuretic Peptide: 27 pg/mL (ref 0.0–100.0)

## 2023-02-24 LAB — RESP PANEL BY RT-PCR (RSV, FLU A&B, COVID)  RVPGX2
Influenza A by PCR: NEGATIVE
Influenza B by PCR: NEGATIVE
Resp Syncytial Virus by PCR: NEGATIVE
SARS Coronavirus 2 by RT PCR: NEGATIVE

## 2023-02-24 LAB — CBC WITH DIFFERENTIAL/PLATELET
Abs Immature Granulocytes: 0.03 10*3/uL (ref 0.00–0.07)
Basophils Absolute: 0.1 10*3/uL (ref 0.0–0.1)
Basophils Relative: 1 %
Eosinophils Absolute: 0.1 10*3/uL (ref 0.0–0.5)
Eosinophils Relative: 1 %
HCT: 27.5 % — ABNORMAL LOW (ref 39.0–52.0)
Hemoglobin: 7.8 g/dL — ABNORMAL LOW (ref 13.0–17.0)
Immature Granulocytes: 0 %
Lymphocytes Relative: 28 %
Lymphs Abs: 2.5 10*3/uL (ref 0.7–4.0)
MCH: 19.7 pg — ABNORMAL LOW (ref 26.0–34.0)
MCHC: 28.4 g/dL — ABNORMAL LOW (ref 30.0–36.0)
MCV: 69.6 fL — ABNORMAL LOW (ref 80.0–100.0)
Monocytes Absolute: 0.9 10*3/uL (ref 0.1–1.0)
Monocytes Relative: 10 %
Neutro Abs: 5.2 10*3/uL (ref 1.7–7.7)
Neutrophils Relative %: 60 %
Platelets: 466 10*3/uL — ABNORMAL HIGH (ref 150–400)
RBC: 3.95 MIL/uL — ABNORMAL LOW (ref 4.22–5.81)
RDW: 17.9 % — ABNORMAL HIGH (ref 11.5–15.5)
WBC: 8.9 10*3/uL (ref 4.0–10.5)
nRBC: 0 % (ref 0.0–0.2)

## 2023-02-24 LAB — GLUCOSE, CAPILLARY: Glucose-Capillary: 323 mg/dL — ABNORMAL HIGH (ref 70–99)

## 2023-02-24 LAB — FERRITIN: Ferritin: 4 ng/mL — ABNORMAL LOW (ref 24–336)

## 2023-02-24 LAB — MAGNESIUM: Magnesium: 2.2 mg/dL (ref 1.7–2.4)

## 2023-02-24 LAB — TROPONIN I (HIGH SENSITIVITY)
Troponin I (High Sensitivity): 2 ng/L (ref <18)
Troponin I (High Sensitivity): 2 ng/L (ref <18)

## 2023-02-24 LAB — LACTIC ACID, PLASMA: Lactic Acid, Venous: 3.1 mmol/L (ref 0.5–1.9)

## 2023-02-24 MED ORDER — ACETAMINOPHEN 650 MG RE SUPP: 650.0000 mg | Freq: Four times a day (QID) | RECTAL | Status: DC | PRN

## 2023-02-24 MED ORDER — VANCOMYCIN HCL IN DEXTROSE 1-5 GM/200ML-% IV SOLN: 1000.0000 mg | Freq: Once | INTRAVENOUS | Status: DC

## 2023-02-24 MED ORDER — INSULIN ASPART 100 UNIT/ML IJ SOLN: 10.0000 [IU] | Freq: Once | INTRAMUSCULAR | Status: DC

## 2023-02-24 MED ORDER — LISINOPRIL 5 MG PO TABS
2.5000 mg | ORAL_TABLET | Freq: Every day | ORAL | Status: DC
  Administered 2023-02-25 – 2023-03-01 (×4): 2.5 mg via ORAL
  Filled 2023-02-24 (×5): qty 1

## 2023-02-24 MED ORDER — LACTATED RINGERS IV SOLN
150.0000 mL/h | INTRAVENOUS | Status: DC
Start: 2023-02-25 — End: 2023-02-25

## 2023-02-24 MED ORDER — MAGNESIUM HYDROXIDE 400 MG/5ML PO SUSP: 30.0000 mL | Freq: Every day | ORAL | Status: DC | PRN

## 2023-02-24 MED ORDER — INSULIN GLARGINE-LIXISENATIDE 100-33 UNT-MCG/ML ~~LOC~~ SOPN: 60.0000 [IU] | PEN_INJECTOR | Freq: Every day | SUBCUTANEOUS | Status: DC

## 2023-02-24 MED ORDER — PIPERACILLIN-TAZOBACTAM 3.375 G IVPB
3.3750 g | Freq: Three times a day (TID) | INTRAVENOUS | Status: DC
  Administered 2023-02-25 – 2023-03-01 (×13): 3.375 g via INTRAVENOUS
  Filled 2023-02-24 (×12): qty 50

## 2023-02-24 MED ORDER — VANCOMYCIN HCL 750 MG/150ML IV SOLN
750.0000 mg | Freq: Three times a day (TID) | INTRAVENOUS | Status: DC
  Administered 2023-02-24 – 2023-02-26 (×4): 750 mg via INTRAVENOUS
  Filled 2023-02-24 (×4): qty 150

## 2023-02-24 MED ORDER — INSULIN GLARGINE-YFGN 100 UNIT/ML ~~LOC~~ SOLN
60.0000 [IU] | Freq: Every day | SUBCUTANEOUS | Status: DC
  Administered 2023-02-24 – 2023-02-28 (×5): 60 [IU] via SUBCUTANEOUS
  Filled 2023-02-24 (×6): qty 0.6

## 2023-02-24 MED ORDER — LACTATED RINGERS IV BOLUS (SEPSIS)
1000.0000 mL | Freq: Once | INTRAVENOUS | Status: AC
Start: 2023-02-24 — End: 2023-02-25
  Administered 2023-02-25: 1000 mL via INTRAVENOUS

## 2023-02-24 MED ORDER — SODIUM CHLORIDE 0.9 % IV BOLUS
500.0000 mL | Freq: Once | INTRAVENOUS | Status: AC
  Administered 2023-02-24: 500 mL via INTRAVENOUS

## 2023-02-24 MED ORDER — ACETAMINOPHEN 325 MG PO TABS
650.0000 mg | ORAL_TABLET | Freq: Four times a day (QID) | ORAL | Status: DC | PRN
  Administered 2023-02-26: 650 mg via ORAL
  Filled 2023-02-24: qty 2

## 2023-02-24 MED ORDER — GABAPENTIN 300 MG PO CAPS
600.0000 mg | ORAL_CAPSULE | Freq: Every day | ORAL | Status: DC
  Administered 2023-02-24 – 2023-03-01 (×5): 600 mg via ORAL
  Filled 2023-02-24 (×6): qty 2

## 2023-02-24 MED ORDER — LACTATED RINGERS IV BOLUS (SEPSIS)
1000.0000 mL | Freq: Once | INTRAVENOUS | Status: AC
Start: 2023-02-24 — End: 2023-02-25
  Administered 2023-02-24: 1000 mL via INTRAVENOUS

## 2023-02-24 MED ORDER — IOHEXOL 350 MG/ML SOLN
75.0000 mL | Freq: Once | INTRAVENOUS | Status: AC | PRN
Start: 2023-02-24 — End: 2023-02-24
  Administered 2023-02-24: 75 mL via INTRAVENOUS

## 2023-02-24 MED ORDER — METRONIDAZOLE 500 MG PO TABS
500.0000 mg | ORAL_TABLET | Freq: Two times a day (BID) | ORAL | Status: DC
  Administered 2023-02-24: 500 mg via ORAL
  Filled 2023-02-24: qty 1

## 2023-02-24 MED ORDER — ACETAMINOPHEN 325 MG PO TABS
650.0000 mg | ORAL_TABLET | Freq: Once | ORAL | Status: AC
  Administered 2023-02-24: 650 mg via ORAL
  Filled 2023-02-24: qty 2

## 2023-02-24 MED ORDER — BISMUTH SUBSALICYLATE 262 MG/15ML PO SUSP: 30.0000 mL | Freq: Four times a day (QID) | ORAL | Status: DC | PRN

## 2023-02-24 MED ORDER — TRAZODONE HCL 50 MG PO TABS
25.0000 mg | ORAL_TABLET | Freq: Every evening | ORAL | Status: DC | PRN
Start: 2023-02-24 — End: 2023-02-26
  Administered 2023-02-24: 25 mg via ORAL
  Filled 2023-02-24: qty 1

## 2023-02-24 MED ORDER — ONDANSETRON HCL 4 MG PO TABS: 4.0000 mg | ORAL_TABLET | Freq: Four times a day (QID) | ORAL | Status: DC | PRN

## 2023-02-24 MED ORDER — ONDANSETRON HCL 4 MG/2ML IJ SOLN
4.0000 mg | Freq: Four times a day (QID) | INTRAMUSCULAR | Status: DC | PRN
  Administered 2023-02-28: 4 mg via INTRAVENOUS

## 2023-02-24 MED ORDER — ATORVASTATIN CALCIUM 40 MG PO TABS
40.0000 mg | ORAL_TABLET | Freq: Every day | ORAL | Status: DC
  Administered 2023-02-25 – 2023-03-01 (×4): 40 mg via ORAL
  Filled 2023-02-24 (×5): qty 1

## 2023-02-24 MED ORDER — GLIPIZIDE ER 5 MG PO TB24: 5.0000 mg | ORAL_TABLET | Freq: Every day | ORAL | Status: DC

## 2023-02-24 MED ORDER — PANTOPRAZOLE SODIUM 40 MG IV SOLR
40.0000 mg | Freq: Two times a day (BID) | INTRAVENOUS | Status: DC
  Administered 2023-02-24 – 2023-03-01 (×9): 40 mg via INTRAVENOUS
  Filled 2023-02-24 (×10): qty 10

## 2023-02-24 MED ORDER — SODIUM CHLORIDE 0.9 % IV SOLN: INTRAVENOUS | Status: AC

## 2023-02-24 MED ORDER — LACTATED RINGERS IV BOLUS (SEPSIS)
1000.0000 mL | Freq: Once | INTRAVENOUS | Status: AC
  Administered 2023-02-25: 1000 mL via INTRAVENOUS

## 2023-02-24 MED ORDER — INSULIN ASPART 100 UNIT/ML IJ SOLN
8.0000 [IU] | Freq: Once | INTRAMUSCULAR | Status: AC
  Administered 2023-02-24: 8 [IU] via SUBCUTANEOUS
  Filled 2023-02-24: qty 1

## 2023-02-24 MED ORDER — LACTATED RINGERS IV BOLUS (SEPSIS)
1000.0000 mL | Freq: Once | INTRAVENOUS | Status: DC
  Administered 2023-02-25: 1000 mL via INTRAVENOUS

## 2023-02-24 MED ORDER — SODIUM CHLORIDE 0.9 % IV SOLN
2.0000 g | INTRAVENOUS | Status: DC
Start: 2023-02-24 — End: 2023-02-24
  Administered 2023-02-24: 2 g via INTRAVENOUS
  Filled 2023-02-24: qty 20

## 2023-02-24 NOTE — ED Provider Notes (Signed)
[place] EMERGENCY DEPARTMENT AT [facility] Provider Note   CSN: [id number] Arrival date & time: 02/24/23  1203     History  Chief Complaint  Patient presents with   Shortness of Breath    Kevin Strong is a 57 y.o. male.   Shortness of Breath Associated symptoms: chest pain (intermittent chest pain), cough and headaches   Associated symptoms: no abdominal pain, no fever and no vomiting        Kevin Strong is a 57 y.o. male with past medical history of type 2 diabetes, hyperlipidemia, hypertension and obesity who presents to the Emergency Department complaining of gradually worsening shortness of breath x 1 month.  Dyspnea mostly with exertion.  States he cannot walk more than just a few steps without having to stop to catch his breath.  Endorses mostly nonproductive cough as well.  Has some intermittent dull chest pain associated with coughing.  At times becomes dizzy when he is short of breath.  He denies any fever or chills.  States his blood sugars have been high for quite some time.  His diabetic medications have recently been changed but he endorses elevated A1c  States he was evaluated at [facility] in October and found to have low Hgb and and patient reported blood transfusion there, had CT abdomen and pelvis without acute finding and he was transferred to [facility] health for evaluation by GI and plan for EGD.  Medical record stated that pt left AMA without notifying anyone.    He also complains of pain redness and swelling to his right second toe that has been present for some time.  States he was seen here in September for same.  He endorses no improvement.  Denies known injury, states the tip of his toe "fell off" he is noticed some foul-smelling drainage from the tip of his toe.  He was treated here with doxycycline in September without improvement.  Home Medications Prior to Admission medications   Medication Sig Start Date End Date Taking? Authorizing  Provider  aspirin EC 81 MG tablet Take 81 mg by mouth daily. Swallow whole.    [provider]  atorvastatin (LIPITOR) 40 MG tablet Take 40 mg by mouth daily. 10/02/21   [provider]  Blood Glucose Monitoring Suppl (ACCU-CHEK GUIDE ME) w/Device KIT 1 Piece by Does not apply route as directed. 11/03/21   Roma Kayser, MD  Continuous Blood Gluc Receiver (FREESTYLE LIBRE 2 READER) DEVI As directed 11/03/21   Roma Kayser, MD  Continuous Blood Gluc Sensor (FREESTYLE LIBRE 3 SENSOR) MISC 1 Piece by Does not apply route every 14 (fourteen) days. Place 1 sensor on the skin every 14 days. Use to check glucose continuously 11/16/21   Roma Kayser, MD  doxycycline (VIBRAMYCIN) 100 MG capsule Take 1 capsule (100 mg total) by mouth 2 (two) times daily. 11/27/22   Dione Booze, MD  furosemide (LASIX) 20 MG tablet Take 20 mg by mouth daily as needed. 10/02/21   [provider]  gabapentin (NEURONTIN) 600 MG tablet Take 1-4 tablets by mouth 4 (four) times daily. 07/03/21   [provider]  glipiZIDE (GLUCOTROL XL) 5 MG 24 hr tablet Take 1 tablet (5 mg total) by mouth daily with breakfast. 11/03/21   Nida, Denman George, MD  glucose blood (ACCU-CHEK GUIDE) test strip Use as instructed 11/03/21   Roma Kayser, MD  Insulin Glargine-Lixisenatide 100-33 UNT-MCG/ML SOPN Inject 60 Units into the skin daily with breakfast.  [provider]  lidocaine (XYLOCAINE) 5 % ointment Apply 1 Application topically 4 (four) times daily as needed.    [provider]  lisinopril (ZESTRIL) 2.5 MG tablet Take 2.5 mg by mouth daily. 10/27/21   [provider]  meloxicam (MOBIC) 15 MG tablet Take 15 mg by mouth daily. 10/05/21   [provider]  meloxicam (MOBIC) 7.5 MG tablet Take 7.5 mg by mouth 2 (two) times daily as needed. 05/28/21   [provider]  montelukast (SINGULAIR) 10 MG tablet Take 10 mg by mouth at bedtime. Patient  not taking: Reported on 11/03/2021    [provider]  pantoprazole (PROTONIX) 40 MG tablet Take 40 mg by mouth 2 (two) times daily. Patient not taking: Reported on 11/03/2021    [provider]  tamsulosin (FLOMAX) 0.4 MG CAPS capsule Take 0.4 mg by mouth daily. Patient not taking: Reported on 11/03/2021 10/02/21   [provider]  TRULICITY 3 MG/0.5ML SOPN Inject 3 mg into the skin once a week. 10/27/21   [provider]      Allergies    Patient has no known allergies.    Review of Systems   Review of Systems  Constitutional:  Positive for chills and fatigue. Negative for appetite change and fever.  Respiratory:  Positive for cough and shortness of breath.   Cardiovascular:  Positive for chest pain (intermittent chest pain).  Gastrointestinal:  Negative for abdominal pain, diarrhea, nausea and vomiting.  Genitourinary:  Negative for dysuria and flank pain.  Musculoskeletal:  Positive for arthralgias (right second toe pain).  Skin:  Positive for color change and wound.  Neurological:  Positive for headaches. Negative for dizziness, syncope, weakness and numbness.  Psychiatric/Behavioral:  Negative for confusion.     Physical Exam Updated Vital Signs BP 127/72   Pulse 86   Temp 98.3 F (36.8 C) (Oral)   Resp (!) 22   Ht 6' (1.829 m)   Wt 116.1 kg   SpO2 94%   BMI 34.72 kg/m  Physical Exam Vitals and nursing note reviewed. Exam conducted with a chaperone present.  Constitutional:      Appearance: He is well-developed. He is obese.  HENT:     Mouth/Throat:     Mouth: Mucous membranes are moist.  Cardiovascular:     Rate and Rhythm: Normal rate and regular rhythm.     Pulses: Normal pulses.  Pulmonary:     Effort: Pulmonary effort is normal. No respiratory distress.     Breath sounds: Normal breath sounds.     Comments: Pt's breathing slightly labored with speech.  Lungs clear to auscultation bilaterally  Chest:     Chest wall: No  tenderness.  Abdominal:     Palpations: Abdomen is soft.     Tenderness: There is no abdominal tenderness.  Genitourinary:    Rectum: Guaiac result negative. No mass, tenderness or external hemorrhoid. Normal anal tone.     Comments: DRE performed by me.  Small amount of dark brown stool, heme-negative.  No palpable rectal masses Musculoskeletal:        General: Swelling and tenderness present.     Right lower leg: No edema.     Left lower leg: No edema.     Comments: Erythema and edema right second with chronic appearing open wound to the distal tip.  No lymphangitis.  See Attached photo.  Skin:    General: Skin is warm.     Capillary Refill: Capillary refill takes less  than 2 seconds.  Neurological:     General: No focal deficit present.     Mental Status: He is alert.     Sensory: No sensory deficit.     Motor: No weakness.          ED Results / Procedures / Treatments   Labs (all labs ordered are listed, but only abnormal results are displayed) Labs Reviewed  LACTIC ACID, PLASMA - Abnormal; Notable for the following components:      Result Value   Lactic Acid, Venous 3.1 (*)    All other components within normal limits  CBC WITH DIFFERENTIAL/PLATELET - Abnormal; Notable for the following components:   RBC 3.95 (*)    Hemoglobin 7.8 (*)    HCT 27.5 (*)    MCV 69.6 (*)    MCH 19.7 (*)    MCHC 28.4 (*)    RDW 17.9 (*)    Platelets 466 (*)    All other components within normal limits  COMPREHENSIVE METABOLIC PANEL - Abnormal; Notable for the following components:   Sodium 134 (*)    CO2 20 (*)    Glucose, Bld 432 (*)    AST 13 (*)    All other components within normal limits  RESP PANEL BY RT-PCR (RSV, FLU A&B, COVID)  [id number]  TYPE AND SCREEN  TROPONIN I (HIGH SENSITIVITY)  TROPONIN I (HIGH SENSITIVITY)    EKG EKG Interpretation Date/Time:  Thursday February 24 2023 12:14:18 EST Ventricular Rate:  100 PR Interval:  158 QRS Duration:  74 QT  Interval:  340 QTC Calculation: 438 R Axis:   44  Text Interpretation: Normal sinus rhythm Normal ECG When compared with ECG of 02-Mar-2005 11:37, Vent. rate has increased BY  35 BPM Nonspecific T wave abnormality now evident in Lateral leads Confirmed by Meridee Score [id number]) on 02/24/2023 2:52:51 PM  Radiology CT Angio Chest PE W and/or Wo Contrast  Result Date: 02/24/2023 CLINICAL DATA:  Concern for pulmonary edema. EXAM: CT ANGIOGRAPHY CHEST WITH CONTRAST TECHNIQUE: Multidetector CT imaging of the chest was performed using the standard protocol during bolus administration of intravenous contrast. Multiplanar CT image reconstructions and MIPs were obtained to evaluate the vascular anatomy. RADIATION DOSE REDUCTION: This exam was performed according to the departmental dose-optimization program which includes automated exposure control, adjustment of the mA and/or kV according to patient size and/or use of iterative reconstruction technique. CONTRAST:  75mL OMNIPAQUE IOHEXOL 350 MG/ML SOLN COMPARISON:  Chest radiograph dated 02/24/2023. FINDINGS: Cardiovascular: There is no cardiomegaly or pericardial effusion. There is 3 vessel coronary vascular calcification. The thoracic aorta is unremarkable. The origins of the great vessels of the aortic arch appear patent. Evaluation of the pulmonary arteries is limited due to respiratory motion and suboptimal opacification and timing of the contrast. No obvious large or central pulmonary artery embolus identified. Mediastinum/Nodes: No hilar or mediastinal adenopathy. The esophagus is grossly unremarkable. No mediastinal fluid collection. Lungs/Pleura: The lungs are clear. There is no pleural effusion pneumothorax. The central airways are patent. Upper Abdomen: No acute abnormality. Musculoskeletal: No acute osseous pathology. Review of the MIP images confirms the above findings. IMPRESSION: 1. No acute intrathoracic pathology. No central pulmonary artery embolus.  2. Coronary vascular calcification. Electronically Signed   By: Elgie Collard M.D.   On: 02/24/2023 19:59   DG Toe 2nd Right  Result Date: 02/24/2023 CLINICAL DATA:  Redness and swelling of the toe EXAM: RIGHT SECOND TOE COMPARISON:  Right second toe radiograph dated 01/31/2020 FINDINGS: Soft  tissue edema about the second toe with irregular at the distal aspect. Irregular cortical lucency underlying the soft tissue wound involving the second toe distal phalanx. There is no evidence of fracture or dislocation. IMPRESSION: Distal second toe soft tissue wound with underlying irregular cortical lucency of the second toe distal phalanx, suspicious for osteomyelitis. Electronically Signed   By: Agustin Cree M.D.   On: 02/24/2023 16:52   DG Chest 2 View  Result Date: 02/24/2023 CLINICAL DATA:  Shortness of breath. EXAM: CHEST - 2 VIEW COMPARISON:  01/07/2023. FINDINGS: Bilateral lung fields are clear. Bilateral costophrenic angles are clear. Normal cardio-mediastinal silhouette. No acute osseous abnormalities. The soft tissues are within normal limits. IMPRESSION: No active cardiopulmonary disease. Electronically Signed   By: Jules Schick M.D.   On: 02/24/2023 14:13    Procedures Procedures    CRITICAL CARE Performed by: Christabella Alvira Total critical care time: 35 minutes Critical care time was exclusive of separately billable procedures and treating other patients. Critical care was necessary to treat or prevent imminent or life-threatening deterioration. Critical care was time spent personally by me on the following activities: development of treatment plan with patient and/or surrogate as well as nursing, discussions with consultants, evaluation of patient's response to treatment, examination of patient, obtaining history from patient or surrogate, ordering and performing treatments and interventions, ordering and review of laboratory studies, ordering and review of radiographic studies, pulse  oximetry and re-evaluation of patient's condition.  Patient's ED course required consultations to specialist including orthopedics and GI.  Review of prior medical records and review of previous imaging and multiple IV medications including IV fluids, insulin, and antibiotics   Medications Ordered in ED Medications - No data to display  ED Course/ Medical Decision Making/ A&P                                 Medical Decision Making Diabetic patient with poor glycemic control, here for evaluation of dyspnea on exertion.  Reports symptoms gradually worsening x 1 month unable to make 6-8 steps today without labored breathing.  He endorses elevated blood sugar for some time, 500 earlier today.  Denies any missed doses of his medications.  Chest pain associated with coughing only.  Denies any fever or chills. Also reports pain redness and swelling of his right second toe this has been present since September, unchanged.  Pt notes foul-smelling drainage from the distal tip. I do not appreciate any drainage on exam  Clinically, suspect hyperglycemia secondary to his toe infection.  Osteomyelitis is of concern.  Dyspnea on exertion possibly related to low hemoglobin, PE, ACS also considered as well as pneumonia.  On further history, patient does endorse excessive use of ibuprofen and notes having abdominal pain postprandial.  Takes 20 mg omeprazole daily  Amount and/or Complexity of Data Reviewed External Data Reviewed: radiology.    Details: On review of care everywhere, patient noted to have CT abdomen and pelvis in October at [facility] ER. Results without acute finding Labs: ordered.    Details: Labs interpreted by me, no evidence of leukocytosis, hemoglobin 7.8.  It was 10.83 months ago, BNP, magnesium reassuring.  Delta troponin unremarkable. Radiology: ordered.    Details: X-ray of the second toe shows likely osteomyelitis distal phalanx second right toe  Chest x-ray without acute cardiopulmonary  finding  CT angio of the chest negative for PE   ECG/medicine tests: ordered.    Details:  EKG shows normal sinus rhythm Discussion of management or test interpretation with external provider(s): On recheck CBG 290 after 500 cc IV fluid.  8 units subcu insulin given  Consulted with GI, Dr. Tasia Catchings.  Request ferritin level be added.  If Patient stable and CT chest reassuring, request patient remain n.p.o. after midnight and he will plan EGD tomorrow.  Rocephin and Flagyl ordered for patient's osteomyelitis of the toe  Consulted with [facility] hospitalist, Dr. Arville Care who agrees to admit  Risk OTC drugs. Prescription drug management.           Final Clinical Impression(s) / ED Diagnoses Final diagnoses:  Symptomatic anemia  Osteomyelitis of second toe of right foot (HCC)  Hyperglycemia    Rx / DC Orders ED Discharge Orders     None         Rosey Bath 02/24/23 2039    Terrilee Files, MD 02/25/23 1014

## 2023-02-24 NOTE — Assessment & Plan Note (Signed)
-   We will continue his antihypertensive therapy. 

## 2023-02-24 NOTE — H&P (Addendum)
[place]   PATIENT NAME: Kevin Strong    MR#:  [id number]  DATE OF BIRTH:  12-Feb-1966  DATE OF ADMISSION:  02/24/2023  PRIMARY CARE PHYSICIAN: Elfredia Nevins, MD   Patient is coming from: Home  REQUESTING/REFERRING PHYSICIAN: Triplett, Tammy, PA-C.  CHIEF COMPLAINT:   Chief Complaint  Patient presents with   Shortness of Breath    HISTORY OF PRESENT ILLNESS:  Kevin Strong is a 57 y.o. male with medical history significant for type II diabetes mellitus, hypertension and dyslipidemia, presented to the emergency room with acute onset of worsening dyspnea which has been going on over the last month.  She has been having dyspnea on exertion for the last month that got significantly worse over the last several days.  He cannot go 6-8 steps without having worsening dyspnea.  He admitted to chest pain only with cough.  No fever or chills.  He was seen in [place] ER in October for similar symptoms and found to be anemic with with he was transfused and later transferred to [facility] where he was supposed to have an EGD but he was dissatisfied with the service and left AMA.  He has been having right second toe swelling since September.  He was seen for that in the ED here and was given p.o. doxycycline for toe infection.  This unfortunately has been getting worse with worsening swelling and mild tenderness with erythema.  This has been associated with foul smell drainage.  He denied any fever or chills.  No nausea or vomiting or abdominal pain.  No cough or wheezing or hemoptysis.  No adequate dizziness or blurred vision.  No dysuria, oliguria or hematuria or flank pain.  ED Course: Upon presentation to the emergency room, BP was 148/70 with heart rate 106 and respiratory to 22 and otherwise normal vital signs.  Labs revealed mild hyponatremia 134 and CO2 of 20 with blood glucose of 432 and high sensitive troponin I of 2 twice and BNP 27.  Lactic acid was 3.1  and CBC showed anemia with hemoglobin 7.8 hematocrit 27.5 compared to 10.8 and 34 on 11/26/2022 with microcytosis and thrombocytosis.  Blood group was O+ with negative antibody screen.  Stool Hemoccult came back negative. EKG as reviewed by me : EKG showed normal sinus rhythm with a rate of 100. Imaging: Right second toe x-ray showed distal second toe soft tissue wound with underlying irregular cortical lucency of the second toe distal phalanx suspicious for osteomyelitis.  The patient was given IV Rocephin and Flagyl as well as 500 mL IV normal saline bolus and 8 units of subcutaneous NovoLog.  Dr. Tasia Catchings was notified about the patient and recommended keeping him n.p.o. after midnight.  He will be admitted to a medical telemetry bed for further evaluation and management. PAST MEDICAL HISTORY:   Past Medical History:  Diagnosis Date   Diabetes mellitus, type II (HCC)    Hyperlipidemia    Hypertension     PAST SURGICAL HISTORY:   Past Surgical History:  Procedure Laterality Date   ELBOW SURGERY     KNEE SURGERY      SOCIAL HISTORY:   Social History   Tobacco Use   Smoking status: Never   Smokeless tobacco: Never  Substance Use Topics   Alcohol use: Never    FAMILY HISTORY:   Family History  Problem Relation Age of Onset   Hypertension Mother    Thyroid disease Mother  Diabetes Mother    Hypertension Father    Hyperlipidemia Father     DRUG ALLERGIES:  No Known Allergies  REVIEW OF SYSTEMS:   ROS As per history of present illness. All pertinent systems were reviewed above. Constitutional, HEENT, cardiovascular, respiratory, GI, GU, musculoskeletal, neuro, psychiatric, endocrine, integumentary and hematologic systems were reviewed and are otherwise negative/unremarkable except for positive findings mentioned above in the HPI.   MEDICATIONS AT HOME:   Prior to Admission medications   Medication Sig Start Date End Date Taking? Authorizing Provider  acetaminophen  (TYLENOL) 500 MG tablet Take 1,500 mg by mouth every 6 (six) hours as needed for moderate pain (pain score 4-6).   Yes [provider]  aspirin EC 81 MG tablet Take 81 mg by mouth daily. Swallow whole.   Yes [provider]  atorvastatin (LIPITOR) 40 MG tablet Take 40 mg by mouth daily. 10/02/21  Yes [provider]  bismuth subsalicylate (PEPTO BISMOL) 262 MG/15ML suspension Take 30 mLs by mouth every 6 (six) hours as needed for indigestion.   Yes [provider]  clotrimazole-betamethasone (LOTRISONE) cream Apply 1 Application topically 2 (two) times daily. 11/05/22  Yes [provider]  gabapentin (NEURONTIN) 600 MG tablet Take 600 mg by mouth daily. 07/03/21  Yes [provider]  glipiZIDE (GLUCOTROL XL) 5 MG 24 hr tablet Take 1 tablet (5 mg total) by mouth daily with breakfast. 11/03/21  Yes Nida, Denman George, MD  ibuprofen (ADVIL) 200 MG tablet Take 800 mg by mouth every 6 (six) hours as needed for mild pain (pain score 1-3).   Yes [provider]  Insulin Glargine-Lixisenatide 100-33 UNT-MCG/ML SOPN Inject 60 Units into the skin at bedtime.   Yes [provider]  lisinopril (ZESTRIL) 2.5 MG tablet Take 2.5 mg by mouth daily. 10/27/21  Yes [provider]  meloxicam (MOBIC) 7.5 MG tablet Take 7.5 mg by mouth daily. 05/28/21  Yes [provider]  neomycin-bacitracin-polymyxin (NEOSPORIN) [phone] ointment Apply 1 Application topically in the morning and at bedtime.   Yes [provider]  omeprazole (PRILOSEC) 20 MG capsule Take 20 mg by mouth daily.   Yes [provider]  Blood Glucose Monitoring Suppl (ACCU-CHEK GUIDE ME) w/Device KIT 1 Piece by Does not apply route as directed. 11/03/21   Roma Kayser, MD  Continuous Blood Gluc Receiver (FREESTYLE LIBRE 2 READER) DEVI As directed 11/03/21   Roma Kayser, MD  Continuous Blood Gluc Sensor (FREESTYLE LIBRE 3 SENSOR) MISC 1  Piece by Does not apply route every 14 (fourteen) days. Place 1 sensor on the skin every 14 days. Use to check glucose continuously 11/16/21   Roma Kayser, MD  glucose blood (ACCU-CHEK GUIDE) test strip Use as instructed 11/03/21   Roma Kayser, MD      VITAL SIGNS:  Blood pressure 132/72, pulse 78, temperature 98.5 F (36.9 C), temperature source Oral, resp. rate 18, height 6' (1.829 m), weight 119.4 kg, SpO2 100%.  PHYSICAL EXAMINATION:  Physical Exam  GENERAL:  57 y.o.-year-old Caucasian male patient lying in the bed with no acute distress.  EYES: Pupils equal, round, reactive to light and accommodation. No scleral icterus.  Positive pallor.  Extraocular muscles intact.  HEENT: Head atraumatic, normocephalic. Oropharynx and nasopharynx clear.  NECK:  Supple, no jugular venous distention. No thyroid enlargement, no tenderness.  LUNGS: Normal breath sounds bilaterally, no wheezing, rales,rhonchi or crepitation. No use of accessory muscles of respiration.  CARDIOVASCULAR: Regular rate and rhythm,  S1, S2 normal. No murmurs, rubs, or gallops.  ABDOMEN: Soft, nondistended, nontender. Bowel sounds present. No organomegaly or mass.  EXTREMITIES: No pedal edema, cyanosis, or clubbing.  NEUROLOGIC: Cranial nerves II through XII are intact. Muscle strength 5/5 in all extremities. Sensation intact. Gait not checked.  PSYCHIATRIC: The patient is alert and oriented x 3.  Normal affect and good eye contact. SKIN: Right second toe swelling with dorsal erythema, yellowish and whitish plantar side discoloration and sloughing tip.     LABORATORY PANEL:   CBC Recent Labs  Lab 02/24/23 1301  WBC 8.9  HGB 7.8*  HCT 27.5*  PLT 466*   ------------------------------------------------------------------------------------------------------------------  Chemistries  Recent Labs  Lab 02/24/23 1301 02/24/23 1515  NA 134*  --   K 3.9  --   CL 101  --   CO2 20*  --   GLUCOSE 432*   --   BUN 19  --   CREATININE 1.00  --   CALCIUM 8.9  --   MG  --  2.2  AST 13*  --   ALT 13  --   ALKPHOS 74  --   BILITOT 0.2  --    ------------------------------------------------------------------------------------------------------------------  Cardiac Enzymes No results for input(s): "TROPONINI" in the last 168 hours. ------------------------------------------------------------------------------------------------------------------  RADIOLOGY:  CT Angio Chest PE W and/or Wo Contrast  Result Date: 02/24/2023 CLINICAL DATA:  Concern for pulmonary edema. EXAM: CT ANGIOGRAPHY CHEST WITH CONTRAST TECHNIQUE: Multidetector CT imaging of the chest was performed using the standard protocol during bolus administration of intravenous contrast. Multiplanar CT image reconstructions and MIPs were obtained to evaluate the vascular anatomy. RADIATION DOSE REDUCTION: This exam was performed according to the departmental dose-optimization program which includes automated exposure control, adjustment of the mA and/or kV according to patient size and/or use of iterative reconstruction technique. CONTRAST:  75mL OMNIPAQUE IOHEXOL 350 MG/ML SOLN COMPARISON:  Chest radiograph dated 02/24/2023. FINDINGS: Cardiovascular: There is no cardiomegaly or pericardial effusion. There is 3 vessel coronary vascular calcification. The thoracic aorta is unremarkable. The origins of the great vessels of the aortic arch appear patent. Evaluation of the pulmonary arteries is limited due to respiratory motion and suboptimal opacification and timing of the contrast. No obvious large or central pulmonary artery embolus identified. Mediastinum/Nodes: No hilar or mediastinal adenopathy. The esophagus is grossly unremarkable. No mediastinal fluid collection. Lungs/Pleura: The lungs are clear. There is no pleural effusion pneumothorax. The central airways are patent. Upper Abdomen: No acute abnormality. Musculoskeletal: No acute osseous  pathology. Review of the MIP images confirms the above findings. IMPRESSION: 1. No acute intrathoracic pathology. No central pulmonary artery embolus. 2. Coronary vascular calcification. Electronically Signed   By: Elgie Collard M.D.   On: 02/24/2023 19:59   DG Toe 2nd Right  Result Date: 02/24/2023 CLINICAL DATA:  Redness and swelling of the toe EXAM: RIGHT SECOND TOE COMPARISON:  Right second toe radiograph dated 01/31/2020 FINDINGS: Soft tissue edema about the second toe with irregular at the distal aspect. Irregular cortical lucency underlying the soft tissue wound involving the second toe distal phalanx. There is no evidence of fracture or dislocation. IMPRESSION: Distal second toe soft tissue wound with underlying irregular cortical lucency of the second toe distal phalanx, suspicious for osteomyelitis. Electronically Signed   By: Agustin Cree M.D.   On: 02/24/2023 16:52   DG Chest 2 View  Result Date: 02/24/2023 CLINICAL DATA:  Shortness of breath. EXAM: CHEST - 2 VIEW COMPARISON:  01/07/2023. FINDINGS: Bilateral lung fields are clear.  Bilateral costophrenic angles are clear. Normal cardio-mediastinal silhouette. No acute osseous abnormalities. The soft tissues are within normal limits. IMPRESSION: No active cardiopulmonary disease. Electronically Signed   By: Jules Schick M.D.   On: 02/24/2023 14:13      IMPRESSION AND PLAN:  Assessment and Plan: * Symptomatic anemia - The patient will be admitted to a medical telemetry bed. - He was typed and crossmatch. - We will follow serial hemoglobins and hematocrits. - We will place him for now on IV PPI therapy. - We will hold off aspirin for now. - The patient will be kept n.p.o. after midnight. - GI consultation will be obtained. - Dr. Tasia Catchings was notified and is aware about the patient.  Osteomyelitis of second toe of right foot (HCC) - The patient will be placed on IV Zosyn and vancomycin. - He will need podiatry consultation. - This  can be called in AM.  It can also be arranged on outpatient basis.  Sepsis due to cellulitis [facility]) - This is due to cellulitis of the right second toe. - Sepsis manifested by tachycardia and tachypnea. - Management as above.  Type 2 diabetes mellitus with hyperglycemia, with long-term current use of insulin (HCC) - The patient will be placed on highly resistant to subcutaneous NovoLog supplemental coverage protocol. - We will continue basal coverage. - We will resume his Glucotrol XL. - We will follow frequent fingerstick blood glucose measures. - This is likely secondary to #2.  Dyslipidemia - We will continue statin therapy.  Essential hypertension - We will continue his antihypertensive therapy.  GERD without esophagitis - The will be placed on IV PPI therapy as mentioned above.   DVT prophylaxis: Lovenox.  Advanced Care Planning:  Code Status: full code.  Family Communication:  The plan of care was discussed in details with the patient (and family). I answered all questions. The patient agreed to proceed with the above mentioned plan. Further management will depend upon hospital course. Disposition Plan: Back to previous home environment Consults called: Gastroenterology. All the records are reviewed and case discussed with ED provider.  Status is: Inpatient  At the time of the admission, it appears that the appropriate admission status for this patient is inpatient.  This is judged to be reasonable and necessary in order to provide the required intensity of service to ensure the patient's safety given the presenting symptoms, physical exam findings and initial radiographic and laboratory data in the context of comorbid conditions.  The patient requires inpatient status due to high intensity of service, high risk of further deterioration and high frequency of surveillance required.  I certify that at the time of admission, it is my clinical judgment that the patient will  require inpatient hospital care extending more than 2 midnights.                            Dispo: The patient is from: Home              Anticipated d/c is to: Home              Patient currently is not medically stable to d/c.              Difficult to place patient: No  Hannah Beat M.D on 02/25/2023 at 1:41 AM  [facility]   From 7 PM-7 AM, contact night-coverage [email or web address]  CC: Primary care physician; Elfredia Nevins, MD

## 2023-02-24 NOTE — ED Notes (Signed)
Lactic Acid 3.1  Informed MD

## 2023-02-24 NOTE — ED Triage Notes (Signed)
SOB on exertion x1 month  Pt stated that he can't make it more than 6 steps without having to stop and take a breath.   Dizzy when SOB  Cough x 2weeks

## 2023-02-24 NOTE — Assessment & Plan Note (Signed)
-   The will be placed on IV PPI therapy as mentioned above.

## 2023-02-24 NOTE — Assessment & Plan Note (Addendum)
-   The patient will be admitted to a medical telemetry bed. - He was typed and crossmatch. - We will follow serial hemoglobins and hematocrits. - We will place him for now on IV PPI therapy. - We will hold off aspirin for now. - The patient will be kept n.p.o. after midnight. - GI consultation will be obtained. - Dr. Tasia Catchings was notified and is aware about the patient.

## 2023-02-24 NOTE — ED Notes (Signed)
AC made aware of need for Semglee insulin 60 units from upstairs pharmacy

## 2023-02-24 NOTE — Assessment & Plan Note (Addendum)
-   The patient will be placed on IV Zosyn and vancomycin. - He will need podiatry consultation. - This can be called in AM.  It can also be arranged on outpatient basis.

## 2023-02-24 NOTE — Progress Notes (Signed)
Pharmacy Antibiotic Note  Kevin Strong is a 57 y.o. male admitted on 02/24/2023 with cellulitis.  Pharmacy has been consulted for vancomycin dosing.  Plan: Vancomycin 750 mg IV Q 8 hrs. Goal AUC 400-550.  Expected AUC 490.  Height: 6' (182.9 cm) Weight: 119.4 kg (263 lb 3.7 oz) IBW/kg (Calculated) : 77.6  Temp (24hrs), Avg:98.3 F (36.8 C), Min:98.1 F (36.7 C), Max:98.4 F (36.9 C)  Recent Labs  Lab 02/24/23 1301  WBC 8.9  CREATININE 1.00  LATICACIDVEN 3.1*    Estimated Creatinine Clearance: 107.4 mL/min (by C-G formula based on SCr of 1 mg/dL).    No Known Allergies  Thank you for allowing pharmacy to be a part of this patient's care.  Vernard Gambles, PharmD, BCPS  02/24/2023 11:07 PM

## 2023-02-24 NOTE — Assessment & Plan Note (Addendum)
-   The patient will be placed on highly resistant to subcutaneous NovoLog supplemental coverage protocol. - We will continue basal coverage. - We will resume his Glucotrol XL. - We will follow frequent fingerstick blood glucose measures. - This is likely secondary to #2.

## 2023-02-24 NOTE — ED Notes (Signed)
Pt given ice water and frozen dinner tray

## 2023-02-24 NOTE — Assessment & Plan Note (Signed)
-   The patient will be placed on highly resistant to subcutaneous NovoLog supplemental coverage protocol. - We will follow frequent fingerstick blood glucose measures. - This is likely secondary to #2.

## 2023-02-24 NOTE — Assessment & Plan Note (Signed)
-   We will continue statin therapy. 

## 2023-02-25 ENCOUNTER — Inpatient Hospital Stay (HOSPITAL_COMMUNITY): Payer: 59 | Admitting: Certified Registered Nurse Anesthetist

## 2023-02-25 ENCOUNTER — Encounter (HOSPITAL_COMMUNITY)
Admission: EM | Disposition: A | Discharge status: Home / Self Care | Payer: Self-pay | Source: Home / Self Care | Attending: Internal Medicine

## 2023-02-25 ENCOUNTER — Inpatient Hospital Stay (HOSPITAL_COMMUNITY): Payer: 59

## 2023-02-25 ENCOUNTER — Encounter (HOSPITAL_COMMUNITY): Payer: Self-pay | Admitting: Family Medicine

## 2023-02-25 DIAGNOSIS — K219 Gastro-esophageal reflux disease without esophagitis: Secondary | ICD-10-CM

## 2023-02-25 DIAGNOSIS — D509 Iron deficiency anemia, unspecified: Secondary | ICD-10-CM | POA: Diagnosis not present

## 2023-02-25 DIAGNOSIS — L0390 Cellulitis, unspecified: Secondary | ICD-10-CM | POA: Diagnosis not present

## 2023-02-25 DIAGNOSIS — K3189 Other diseases of stomach and duodenum: Secondary | ICD-10-CM

## 2023-02-25 DIAGNOSIS — K5900 Constipation, unspecified: Secondary | ICD-10-CM

## 2023-02-25 DIAGNOSIS — K921 Melena: Secondary | ICD-10-CM

## 2023-02-25 DIAGNOSIS — D649 Anemia, unspecified: Secondary | ICD-10-CM | POA: Diagnosis not present

## 2023-02-25 DIAGNOSIS — K222 Esophageal obstruction: Secondary | ICD-10-CM

## 2023-02-25 DIAGNOSIS — K269 Duodenal ulcer, unspecified as acute or chronic, without hemorrhage or perforation: Secondary | ICD-10-CM

## 2023-02-25 DIAGNOSIS — L03031 Cellulitis of right toe: Secondary | ICD-10-CM | POA: Diagnosis not present

## 2023-02-25 DIAGNOSIS — A419 Sepsis, unspecified organism: Secondary | ICD-10-CM

## 2023-02-25 DIAGNOSIS — E66812 Obesity, class 2: Secondary | ICD-10-CM

## 2023-02-25 DIAGNOSIS — M869 Osteomyelitis, unspecified: Secondary | ICD-10-CM | POA: Diagnosis not present

## 2023-02-25 DIAGNOSIS — K449 Diaphragmatic hernia without obstruction or gangrene: Secondary | ICD-10-CM

## 2023-02-25 HISTORY — PX: BIOPSY: SHX5522

## 2023-02-25 HISTORY — PX: ESOPHAGOGASTRODUODENOSCOPY (EGD) WITH PROPOFOL: SHX5813

## 2023-02-25 LAB — HEMOGLOBIN A1C
Hgb A1c MFr Bld: 10.9 % — ABNORMAL HIGH (ref 4.8–5.6)
Mean Plasma Glucose: 266.13 mg/dL

## 2023-02-25 LAB — BASIC METABOLIC PANEL
Anion gap: 11 (ref 5–15)
Anion gap: 9 (ref 5–15)
BUN: 17 mg/dL (ref 6–20)
BUN: 13 mg/dL (ref 6–20)
CO2: 20 mmol/L — ABNORMAL LOW (ref 22–32)
CO2: 20 mmol/L — ABNORMAL LOW (ref 22–32)
Calcium: 8.5 mg/dL — ABNORMAL LOW (ref 8.9–10.3)
Calcium: 8.3 mg/dL — ABNORMAL LOW (ref 8.9–10.3)
Chloride: 106 mmol/L (ref 98–111)
Chloride: 108 mmol/L (ref 98–111)
Creatinine, Ser: 0.84 mg/dL (ref 0.61–1.24)
Creatinine, Ser: 0.73 mg/dL (ref 0.61–1.24)
GFR, Estimated: >60 mL/min (ref >60)
GFR, Estimated: >60 mL/min (ref >60)
Glucose, Bld: 326 mg/dL — ABNORMAL HIGH (ref 70–99)
Glucose, Bld: 200 mg/dL — ABNORMAL HIGH (ref 70–99)
Potassium: 3.7 mmol/L (ref 3.5–5.1)
Potassium: 3.4 mmol/L — ABNORMAL LOW (ref 3.5–5.1)
Sodium: 137 mmol/L (ref 135–145)
Sodium: 137 mmol/L (ref 135–145)

## 2023-02-25 LAB — GLUCOSE, CAPILLARY
Glucose-Capillary: 147 mg/dL — ABNORMAL HIGH (ref 70–99)
Glucose-Capillary: 272 mg/dL — ABNORMAL HIGH (ref 70–99)

## 2023-02-25 LAB — LACTIC ACID, PLASMA
Lactic Acid, Venous: 2.1 mmol/L (ref 0.5–1.9)
Lactic Acid, Venous: 2.6 mmol/L (ref 0.5–1.9)

## 2023-02-25 LAB — CBC
HCT: 26.8 % — ABNORMAL LOW (ref 39.0–52.0)
Hemoglobin: 7.1 g/dL — ABNORMAL LOW (ref 13.0–17.0)
MCH: 18.6 pg — ABNORMAL LOW (ref 26.0–34.0)
MCHC: 26.5 g/dL — ABNORMAL LOW (ref 30.0–36.0)
MCV: 70.2 fL — ABNORMAL LOW (ref 80.0–100.0)
Platelets: 360 10*3/uL (ref 150–400)
RBC: 3.82 MIL/uL — ABNORMAL LOW (ref 4.22–5.81)
RDW: 18 % — ABNORMAL HIGH (ref 11.5–15.5)
WBC: 7.3 10*3/uL (ref 4.0–10.5)
nRBC: 0 % (ref 0.0–0.2)

## 2023-02-25 LAB — PROTIME-INR
INR: 1 (ref 0.8–1.2)
Prothrombin Time: 13.5 s (ref 11.4–15.2)

## 2023-02-25 LAB — PREPARE RBC (CROSSMATCH)

## 2023-02-25 LAB — ABO/RH: ABO/RH(D): O POS

## 2023-02-25 LAB — APTT: aPTT: 25 s (ref 24–36)

## 2023-02-25 LAB — HIV ANTIBODY (ROUTINE TESTING W REFLEX): HIV Screen 4th Generation wRfx: NONREACTIVE

## 2023-02-25 SURGERY — ESOPHAGOGASTRODUODENOSCOPY (EGD) WITH PROPOFOL
Anesthesia: General

## 2023-02-25 MED ORDER — PROPOFOL 500 MG/50ML IV EMUL
INTRAVENOUS | Status: DC | PRN
  Administered 2023-02-25: 190 ug/kg/min via INTRAVENOUS

## 2023-02-25 MED ORDER — SODIUM CHLORIDE 0.9 % IV SOLN: INTRAVENOUS | Status: DC

## 2023-02-25 MED ORDER — SODIUM CHLORIDE 0.9 % IV BOLUS
1000.0000 mL | Freq: Once | INTRAVENOUS | Status: AC
Start: 2023-02-25 — End: 2023-02-25
  Administered 2023-02-25: 1000 mL via INTRAVENOUS

## 2023-02-25 MED ORDER — LACTATED RINGERS IV SOLN: INTRAVENOUS | Status: DC | PRN

## 2023-02-25 MED ORDER — POLYETHYLENE GLYCOL 3350 17 G PO PACK: 17.0000 g | PACK | Freq: Every day | ORAL | Status: DC

## 2023-02-25 MED ORDER — GADOBUTROL 1 MMOL/ML IV SOLN
10.0000 mL | Freq: Once | INTRAVENOUS | Status: AC | PRN
  Administered 2023-02-25: 10 mL via INTRAVENOUS

## 2023-02-25 MED ORDER — INSULIN ASPART 100 UNIT/ML IJ SOLN
0.0000 [IU] | Freq: Three times a day (TID) | INTRAMUSCULAR | Status: DC
  Administered 2023-02-25: 2 [IU] via SUBCUTANEOUS
  Administered 2023-02-26 (×2): 5 [IU] via SUBCUTANEOUS
  Administered 2023-02-26: 11 [IU] via SUBCUTANEOUS
  Administered 2023-02-27: 8 [IU] via SUBCUTANEOUS
  Administered 2023-02-27 (×2): 5 [IU] via SUBCUTANEOUS
  Administered 2023-02-28: 3 [IU] via SUBCUTANEOUS
  Administered 2023-02-28: 5 [IU] via SUBCUTANEOUS
  Administered 2023-03-01 (×2): 3 [IU] via SUBCUTANEOUS

## 2023-02-25 MED ORDER — FUROSEMIDE 10 MG/ML IJ SOLN
20.0000 mg | Freq: Once | INTRAMUSCULAR | Status: AC
  Administered 2023-02-25: 20 mg via INTRAVENOUS
  Filled 2023-02-25: qty 2

## 2023-02-25 MED ORDER — POLYETHYLENE GLYCOL 3350 17 G PO PACK
17.0000 g | PACK | Freq: Every day | ORAL | Status: DC
  Administered 2023-03-01: 17 g via ORAL
  Filled 2023-02-25 (×3): qty 1

## 2023-02-25 MED ORDER — SODIUM CHLORIDE 0.9% IV SOLUTION
Freq: Once | INTRAVENOUS | Status: AC
Start: 2023-02-25 — End: 2023-02-26

## 2023-02-25 NOTE — Plan of Care (Signed)

## 2023-02-25 NOTE — Progress Notes (Signed)
Progress Note   Patient: Kevin Strong:[id number] DOB: 1965-04-26 DOA: 02/24/2023     1 DOS: the patient was seen and examined on 02/25/2023   Brief hospital admission course: As per H&P written by Dr. Arville Care on 02/24/2023 Kevin Strong is a 57 y.o. male with medical history significant for type II diabetes mellitus, hypertension and dyslipidemia, presented to the emergency room with acute onset of worsening dyspnea which has been going on over the last month.  She has been having dyspnea on exertion for the last month that got significantly worse over the last several days.  He cannot go 6-8 steps without having worsening dyspnea.  He admitted to chest pain only with cough.  No fever or chills.  He was seen in [place] ER in October for similar symptoms and found to be anemic with with he was transfused and later transferred to [facility] where he was supposed to have an EGD but he was dissatisfied with the service and left AMA.  He has been having right second toe swelling since September.  He was seen for that in the ED here and was given p.o. doxycycline for toe infection.  This unfortunately has been getting worse with worsening swelling and mild tenderness with erythema.  This has been associated with foul smell drainage.  He denied any fever or chills.  No nausea or vomiting or abdominal pain.  No cough or wheezing or hemoptysis.  No adequate dizziness or blurred vision.  No dysuria, oliguria or hematuria or flank pain.   ED Course: Upon presentation to the emergency room, BP was 148/70 with heart rate 106 and respiratory to 22 and otherwise normal vital signs.  Labs revealed mild hyponatremia 134 and CO2 of 20 with blood glucose of 432 and high sensitive troponin I of 2 twice and BNP 27.  Lactic acid was 3.1 and CBC showed anemia with hemoglobin 7.8 hematocrit 27.5 compared to 10.8 and 34 on 11/26/2022 with microcytosis and thrombocytosis.  Blood group was O+ with  negative antibody screen.  Stool Hemoccult came back negative. EKG as reviewed by me : EKG showed normal sinus rhythm with a rate of 100. Imaging: Right second toe x-ray showed distal second toe soft tissue wound with underlying irregular cortical lucency of the second toe distal phalanx suspicious for osteomyelitis.  Assessment and Plan: * Symptomatic anemia - No overt bleeding appreciated -Fecal occult blood test was negative -Hemoglobin down to 7.1 and patient symptomatic; 1 unit PRBCs will be transfused -Continue to follow hemoglobin trend -GI service has been consulted and will pursued endoscopic evaluation as part of anemia workup. -Continue n.p.o. status.  Suspected osteomyelitis of second toe of right foot (HCC) - Continue current IV antibiotics (Zosyn and vancomycin). -Case discussed with orthopedic surgery who recommended general surgery involvement for most likely amputation. -ABI and MRI has been ordered. -Continue as needed analgesics and supportive care.  Lactic acidosis/sepsis due to cellulitis (HCC) - Continue to maintain adequate hydration -Continue current IV antibiotic -Follow lactic acid level -Follow clinical response.  Type 2 diabetes mellitus with hyperglycemia, with long-term current use of insulin (HCC) - Continue sliding scale insulin and Semglee -Follow CBG fluctuation and further adjust hypoglycemic regimen -Update A1c.  Dyslipidemia -Continue statin.  Essential hypertension -Stable and well-controlled -Continue current antihypertensive regimen -Once able to tolerate by mouth.  Planning for heart healthy/modified carbohydrate diet.  GERD without esophagitis -Continue PPI.  Class II obesity -Body mass index is 35.7 kg/m.  -  Portion control, low calorie diet and increase physical activity discussed with patient.   Subjective:  No fever, no chest pain, no nausea vomiting.  Overnight there was no overt bleeding reported.  Patient expressing  feeling weak and blood work with hemoglobin down to 7.1 range.  Physical Exam: Vitals:   02/25/23 1322 02/25/23 1325 02/25/23 1526 02/25/23 1620  BP: 135/79 135/79 130/73 127/75  Pulse: 80 80 82 76  Resp: 19 19 19 20   Temp: 98.5 F (36.9 C) 98.5 F (36.9 C) 98.6 F (37 C) 98.7 F (37.1 C)  TempSrc:  Axillary Oral Oral  SpO2:  100% 99% 100%  Weight:    119.4 kg  Height:    6' (1.829 m)   General exam: Alert, awake, oriented x 3; afebrile, no chest pain, no nausea vomiting.  Reports feeling weak. Respiratory system: Clear to auscultation. Respiratory effort normal.  Good saturation on room air. Cardiovascular system:RRR. No rubs or gallop; no JVD. Gastrointestinal system: Abdomen is obese, nondistended, soft and nontender. No organomegaly or masses felt. Normal bowel sounds heard. Central nervous system: Alert and oriented. No focal neurological deficits. Extremities: No cyanosis or clubbing; right second toe with swelling and appreciated cellulitic changes. Skin: No petechiae. Psychiatry: Judgement and insight appear normal. Mood & affect appropriate.   Data Reviewed: Basic metabolic panel: Sodium 137, potassium 3.7, chloride 106, bicarb 20, glucose 326, BUN 17, creatinine 0.84, GFR>60 CBC: WBC 7.3, hemoglobin 7.1, platelets count 360K Lactic acid:2.6 A1c: Pending  Family Communication: Wife at bedside.  Disposition: Status is: Inpatient Remains inpatient appropriate because: Continue IV antibiotics and further workup for iron deficiency anemia.   Planned Discharge Destination: Home  Time spent: 50 minutes  Author: Vassie Loll, MD 02/25/2023 4:34 PM  For on call review [email or web address].

## 2023-02-25 NOTE — H&P (View-Only) (Signed)
[facility] Consult  Reason for Consult: Right 2nd toe  Referring Physician: Dr. Gwenlyn Perking  Chief Complaint   Shortness of Breath     HPI: Kevin Strong is a 57 y.o. male with new onset SOB and anemia that was seen in the ED and admitted. He also has a 2nd toe that has been draining and had a CT in 11/2022 that showed soft tissue infection. This has not resolved. He has tried to soak it some but says the toe hurts so bad that he has to put it in hot water to get comfort. He is a diabetic and says he wears boots. He knows of no injury to the area.   Past Medical History:  Diagnosis Date   Diabetes mellitus, type II (HCC)    Hyperlipidemia    Hypertension     Past Surgical History:  Procedure Laterality Date   ELBOW SURGERY     KNEE SURGERY      Family History  Problem Relation Age of Onset   Hypertension Mother    Thyroid disease Mother    Diabetes Mother    Hypertension Father    Hyperlipidemia Father     Social History   Tobacco Use   Smoking status: Never   Smokeless tobacco: Never  Vaping Use   Vaping status: Never Used  Substance Use Topics   Alcohol use: Never   Drug use: Never    Medications: I have reviewed the patient's current medications. Prior to Admission:  Medications Prior to Admission  Medication Sig Dispense Refill Last Dose   acetaminophen (TYLENOL) 500 MG tablet Take 1,500 mg by mouth every 6 (six) hours as needed for moderate pain (pain score 4-6).   02/23/2023   aspirin EC 81 MG tablet Take 81 mg by mouth daily. Swallow whole.   02/24/2023 at 0700   atorvastatin (LIPITOR) 40 MG tablet Take 40 mg by mouth daily.   02/24/2023   bismuth subsalicylate (PEPTO BISMOL) 262 MG/15ML suspension Take 30 mLs by mouth every 6 (six) hours as needed for indigestion.   Past Week   clotrimazole-betamethasone (LOTRISONE) cream Apply 1 Application topically 2 (two) times daily.   Past Week   gabapentin (NEURONTIN) 600 MG tablet Take 600 mg by  mouth daily.   02/24/2023   glipiZIDE (GLUCOTROL XL) 5 MG 24 hr tablet Take 1 tablet (5 mg total) by mouth daily with breakfast. 90 tablet 1 02/24/2023   ibuprofen (ADVIL) 200 MG tablet Take 800 mg by mouth every 6 (six) hours as needed for mild pain (pain score 1-3).   02/24/2023   Insulin Glargine-Lixisenatide 100-33 UNT-MCG/ML SOPN Inject 60 Units into the skin at bedtime.   02/23/2023   lisinopril (ZESTRIL) 2.5 MG tablet Take 2.5 mg by mouth daily.   02/24/2023   meloxicam (MOBIC) 7.5 MG tablet Take 7.5 mg by mouth daily.   02/24/2023   neomycin-bacitracin-polymyxin (NEOSPORIN) [phone] ointment Apply 1 Application topically in the morning and at bedtime.   02/24/2023   omeprazole (PRILOSEC) 20 MG capsule Take 20 mg by mouth daily.   02/24/2023   Blood Glucose Monitoring Suppl (ACCU-CHEK GUIDE ME) w/Device KIT 1 Piece by Does not apply route as directed. 1 kit 0    Continuous Blood Gluc Receiver (FREESTYLE LIBRE 2 READER) DEVI As directed 1 each 0    Continuous Blood Gluc Sensor (FREESTYLE LIBRE 3 SENSOR) MISC 1 Piece by Does not apply route every 14 (fourteen) days. Place 1 sensor on the skin every  14 days. Use to check glucose continuously 2 each 2    glucose blood (ACCU-CHEK GUIDE) test strip Use as instructed 150 each 2    Scheduled:  atorvastatin  40 mg Oral Daily   gabapentin  600 mg Oral Daily   insulin aspart  0-15 Units Subcutaneous TID WC   insulin glargine-yfgn  60 Units Subcutaneous QHS   lisinopril  2.5 mg Oral Daily   pantoprazole (PROTONIX) IV  40 mg Intravenous Q12H   Continuous:  sodium chloride Stopped (02/25/23 0036)   sodium chloride 150 mL/hr at 02/25/23 0435   piperacillin-tazobactam 3.375 g (02/25/23 0951)   vancomycin 750 mg (02/25/23 1004)   UEA:[id number] **OR** acetaminophen, bismuth subsalicylate, magnesium hydroxide, ondansetron **OR** ondansetron (ZOFRAN) IV, traZODone  Not on File   ROS:  A comprehensive review of systems was negative except for:  Respiratory: positive for SOB Hematologic/lymphatic: positive for anemia Musculoskeletal: positive for right 2nd toe pain  Blood pressure 135/79, pulse 80, temperature 98.5 F (36.9 C), temperature source Axillary, resp. rate 19, height 6' (1.829 m), weight 119.4 kg, SpO2 100%. Physical Exam Vitals reviewed.  HENT:     Head: Normocephalic.  Cardiovascular:     Rate and Rhythm: Normal rate.     Pulses:          Dorsalis pedis pulses are 2+ on the right side and 2+ on the left side.       Posterior tibial pulses are 2+ on the right side and 2+ on the left side.     Comments: Warm feet and palpable pulses  Pulmonary:     Effort: Pulmonary effort is normal.  Abdominal:     Palpations: Abdomen is soft.  Musculoskeletal:     Cervical back: Normal range of motion.     Comments: Right second toe with edema and discolored skin slightly dusky, no drainage   Neurological:     General: No focal deficit present.     Mental Status: He is alert and oriented to person, place, and time.  Psychiatric:        Mood and Affect: Mood normal.        Behavior: Behavior normal.        Results: Results for orders placed or performed during the hospital encounter of 02/24/23 (from the past 48 hour(s))  Resp panel by RT-PCR (RSV, Flu A&B, Covid) Anterior Nasal Swab     Status: None   Collection Time: 02/24/23 12:14 PM   Specimen: Anterior Nasal Swab  Result Value Ref Range   SARS Coronavirus 2 by RT PCR NEGATIVE NEGATIVE    Comment: (NOTE) SARS-CoV-2 target nucleic acids are NOT DETECTED.  The SARS-CoV-2 RNA is generally detectable in upper respiratory specimens during the acute phase of infection. The lowest concentration of SARS-CoV-2 viral copies this assay can detect is 138 copies/mL. A negative result does not preclude SARS-Cov-2 infection and should not be used as the sole basis for treatment or other patient management decisions. A negative result may occur with  improper specimen  collection/handling, submission of specimen other than nasopharyngeal swab, presence of viral mutation(s) within the areas targeted by this assay, and inadequate number of viral copies(<138 copies/mL). A negative result must be combined with clinical observations, patient history, and epidemiological information. The expected result is Negative.  Fact Sheet for Patients:  BloggerCourse.com  Fact Sheet for Healthcare Providers:  SeriousBroker.it  This test is no t yet approved or cleared by the [place] and  has been authorized  for detection and/or diagnosis of SARS-CoV-2 by FDA under an Emergency Use Authorization (EUA). This EUA will remain  in effect (meaning this test can be used) for the duration of the COVID-19 declaration under Section 564(b)(1) of the Act, 21 U.S.C.section 360bbb-3(b)(1), unless the authorization is terminated  or revoked sooner.       Influenza A by PCR NEGATIVE NEGATIVE   Influenza B by PCR NEGATIVE NEGATIVE    Comment: (NOTE) The Xpert Xpress SARS-CoV-2/FLU/RSV plus assay is intended as an aid in the diagnosis of influenza from Nasopharyngeal swab specimens and should not be used as a sole basis for treatment. Nasal washings and aspirates are unacceptable for Xpert Xpress SARS-CoV-2/FLU/RSV testing.  Fact Sheet for Patients: BloggerCourse.com  Fact Sheet for Healthcare Providers: SeriousBroker.it  This test is not yet approved or cleared by the [place] FDA and has been authorized for detection and/or diagnosis of SARS-CoV-2 by FDA under an Emergency Use Authorization (EUA). This EUA will remain in effect (meaning this test can be used) for the duration of the COVID-19 declaration under Section 564(b)(1) of the Act, 21 U.S.C. section 360bbb-3(b)(1), unless the authorization is terminated or revoked.     Resp Syncytial Virus by PCR  NEGATIVE NEGATIVE    Comment: (NOTE) Fact Sheet for Patients: BloggerCourse.com  Fact Sheet for Healthcare Providers: SeriousBroker.it  This test is not yet approved or cleared by the [place] FDA and has been authorized for detection and/or diagnosis of SARS-CoV-2 by FDA under an Emergency Use Authorization (EUA). This EUA will remain in effect (meaning this test can be used) for the duration of the COVID-19 declaration under Section 564(b)(1) of the Act, 21 U.S.C. section 360bbb-3(b)(1), unless the authorization is terminated or revoked.  Performed at [facility], [address] [place], [place] [address]   Lactic acid, plasma     Status: Abnormal   Collection Time: 02/24/23  1:01 PM  Result Value Ref Range   Lactic Acid, Venous 3.1 (HH) 0.5 - 1.9 mmol/L    Comment: CRITICAL RESULT CALLED TO, READ BACK BY AND VERIFIED WITH B GREENWOOD RN [phone] K FORSYTH Performed at [facility], [address] [place], [place] [address]   CBC with Differential     Status: Abnormal   Collection Time: 02/24/23  1:01 PM  Result Value Ref Range   WBC 8.9 4.0 - 10.5 K/uL   RBC 3.95 (L) 4.22 - 5.81 MIL/uL   Hemoglobin 7.8 (L) 13.0 - 17.0 g/dL    Comment: Reticulocyte Hemoglobin testing may be clinically indicated, consider ordering this additional test [id number]    HCT 27.5 (L) 39.0 - 52.0 %   MCV 69.6 (L) 80.0 - 100.0 fL   MCH 19.7 (L) 26.0 - 34.0 pg   MCHC 28.4 (L) 30.0 - 36.0 g/dL   RDW 46.9 (H) 62.9 - 52.8 %   Platelets 466 (H) 150 - 400 K/uL   nRBC 0.0 0.0 - 0.2 %   Neutrophils Relative % 60 %   Neutro Abs 5.2 1.7 - 7.7 K/uL   Lymphocytes Relative 28 %   Lymphs Abs 2.5 0.7 - 4.0 K/uL   Monocytes Relative 10 %   Monocytes Absolute 0.9 0.1 - 1.0 K/uL   Eosinophils Relative 1 %   Eosinophils Absolute 0.1 0.0 - 0.5 K/uL   Basophils Relative 1 %   Basophils Absolute 0.1 0.0 - 0.1 K/uL   WBC Morphology MORPHOLOGY UNREMARKABLE     RBC Morphology See Note  Comment: MICROCYTOSIS, ANISOCYTOSIS, HYPOCHROMASIA   Smear Review MORPHOLOGY UNREMARKABLE    Immature Granulocytes 0 %   Abs Immature Granulocytes 0.03 0.00 - 0.07 K/uL   Polychromasia PRESENT    Target Cells PRESENT    Ovalocytes PRESENT     Comment: Performed at [facility], [address], [place], [place] [address]  Comprehensive metabolic panel     Status: Abnormal   Collection Time: 02/24/23  1:01 PM  Result Value Ref Range   Sodium 134 (L) 135 - 145 mmol/L   Potassium 3.9 3.5 - 5.1 mmol/L   Chloride 101 98 - 111 mmol/L   CO2 20 (L) 22 - 32 mmol/L   Glucose, Bld 432 (H) 70 - 99 mg/dL    Comment: Glucose reference range applies only to samples taken after fasting for at least 8 hours.   BUN 19 6 - 20 mg/dL   Creatinine, Ser 6.04 0.61 - 1.24 mg/dL   Calcium 8.9 8.9 - 54.0 mg/dL   Total Protein 7.4 6.5 - 8.1 g/dL   Albumin 3.6 3.5 - 5.0 g/dL   AST 13 (L) 15 - 41 U/L   ALT 13 0 - 44 U/L   Alkaline Phosphatase 74 38 - 126 U/L   Total Bilirubin 0.2 <1.2 mg/dL   GFR, Estimated >98 >11 mL/min    Comment: (NOTE) Calculated using the CKD-EPI Creatinine Equation (2021)    Anion gap 13 5 - 15    Comment: Performed at [facility], [address], [place], [place] [address]  Troponin I (High Sensitivity)     Status: None   Collection Time: 02/24/23  1:01 PM  Result Value Ref Range   Troponin I (High Sensitivity) 2 <18 ng/L    Comment: (NOTE) Elevated high sensitivity troponin I (hsTnI) values and significant  changes across serial measurements may suggest ACS but many other  chronic and acute conditions are known to elevate hsTnI results.  Refer to the "Links" section for chest pain algorithms and additional  guidance. Performed at [facility], [address] [place], [place] [address]   Brain natriuretic peptide     Status: None   Collection Time: 02/24/23  1:01 PM  Result Value Ref Range   B Natriuretic Peptide 27.0 0.0 - 100.0 pg/mL     Comment: Performed at [facility], [address] [place], [place] [address]  Magnesium     Status: None   Collection Time: 02/24/23  3:15 PM  Result Value Ref Range   Magnesium 2.2 1.7 - 2.4 mg/dL    Comment: Performed at [facility], [address] [place], [place] [address]  Troponin I (High Sensitivity)     Status: None   Collection Time: 02/24/23  3:17 PM  Result Value Ref Range   Troponin I (High Sensitivity) 2 <18 ng/L    Comment: (NOTE) Elevated high sensitivity troponin I (hsTnI) values and significant  changes across serial measurements may suggest ACS but many other  chronic and acute conditions are known to elevate hsTnI results.  Refer to the "Links" section for chest pain algorithms and additional  guidance. Performed at [facility], [address] [place], [place] [address]   Type and screen [facility]     Status: None (Preliminary result)   Collection Time: 02/24/23  3:17 PM  Result Value Ref Range   ABO/RH(D) O POS    Antibody Screen NEG    Sample Expiration 02/27/2023,2359    Unit Number [id number]    Blood  Component Type RED CELLS,LR    Unit division 00    Status of Unit ISSUED    Transfusion Status OK TO TRANSFUSE    Crossmatch Result      Compatible Performed at [facility], [address], [place], [place] [address]   Prepare RBC (crossmatch)     Status: None   Collection Time: 02/24/23  3:17 PM  Result Value Ref Range   Order Confirmation      ORDER PROCESSED BY BLOOD BANK Performed at [facility], [address] [place], [place] [address]   POC CBG, ED     Status: Abnormal   Collection Time: 02/24/23  5:48 PM  Result Value Ref Range   Glucose-Capillary 294 (H) 70 - 99 mg/dL    Comment: Glucose reference range applies only to samples taken after fasting for at least 8 hours.  Blood Cultures x 2 sites     Status: None (Preliminary result)   Collection Time: 02/24/23  6:29 PM   Specimen: BLOOD  Result Value Ref Range   Specimen  Description BLOOD BLOOD RIGHT HAND    Special Requests      BOTTLES DRAWN AEROBIC AND ANAEROBIC Blood Culture adequate volume   Culture      NO GROWTH < 12 HOURS Performed at [facility], [address], [place], [place] [address]    Report Status PENDING   Blood Cultures x 2 sites     Status: None (Preliminary result)   Collection Time: 02/24/23  6:29 PM   Specimen: BLOOD  Result Value Ref Range   Specimen Description BLOOD LEFT ANTECUBITAL    Special Requests      BOTTLES DRAWN AEROBIC AND ANAEROBIC Blood Culture results may not be optimal due to an inadequate volume of blood received in culture bottles   Culture      NO GROWTH < 12 HOURS Performed at [facility], [address], [place], [place] [address]    Report Status PENDING   Ferritin (Iron Binding Protein)     Status: Abnormal   Collection Time: 02/24/23  6:29 PM  Result Value Ref Range   Ferritin 4 (L) 24 - 336 ng/mL    Comment: Performed at [facility], [address] [place], [place] [address]  CBG monitoring, ED     Status: Abnormal   Collection Time: 02/24/23  7:46 PM  Result Value Ref Range   Glucose-Capillary 261 (H) 70 - 99 mg/dL    Comment: Glucose reference range applies only to samples taken after fasting for at least 8 hours.  HIV Antibody (routine testing w rflx)     Status: None   Collection Time: 02/24/23 11:17 PM  Result Value Ref Range   HIV Screen 4th Generation wRfx Non Reactive Non Reactive    Comment: Performed at [facility], [address] [place], [place] [address]  Lactic acid, plasma     Status: Abnormal   Collection Time: 02/24/23 11:17 PM  Result Value Ref Range   Lactic Acid, Venous 2.1 (HH) 0.5 - 1.9 mmol/L    Comment: CRITICAL RESULT CALLED TO, READ BACK BY AND VERIFIED WITH D. JONES AT 0016 ON 12.06.24 BY ADGER J Performed at [facility], [address], [place], [place] [address]   Protime-INR     Status: None   Collection Time: 02/24/23 11:17 PM  Result Value Ref Range    Prothrombin Time 13.5 11.4 - 15.2 seconds   INR 1.0 0.8 - 1.2  Comment: (NOTE) INR goal varies based on device and disease states. Performed at [facility], [address] [place], [place] [address]   APTT     Status: None   Collection Time: 02/24/23 11:17 PM  Result Value Ref Range   aPTT 25 24 - 36 seconds    Comment: Performed at [facility], [address], [place], [place] [address]  Glucose, capillary     Status: Abnormal   Collection Time: 02/24/23 11:32 PM  Result Value Ref Range   Glucose-Capillary 323 (H) 70 - 99 mg/dL    Comment: Glucose reference range applies only to samples taken after fasting for at least 8 hours.  Basic metabolic panel     Status: Abnormal   Collection Time: 02/25/23  1:29 AM  Result Value Ref Range   Sodium 137 135 - 145 mmol/L   Potassium 3.7 3.5 - 5.1 mmol/L   Chloride 106 98 - 111 mmol/L   CO2 20 (L) 22 - 32 mmol/L   Glucose, Bld 326 (H) 70 - 99 mg/dL    Comment: Glucose reference range applies only to samples taken after fasting for at least 8 hours.   BUN 17 6 - 20 mg/dL   Creatinine, Ser 7.06 0.61 - 1.24 mg/dL   Calcium 8.5 (L) 8.9 - 10.3 mg/dL   GFR, Estimated >23 >76 mL/min    Comment: (NOTE) Calculated using the CKD-EPI Creatinine Equation (2021)    Anion gap 11 5 - 15    Comment: Performed at [facility], [address], [place], [place] [address]  CBC     Status: Abnormal   Collection Time: 02/25/23  1:29 AM  Result Value Ref Range   WBC 7.3 4.0 - 10.5 K/uL   RBC 3.82 (L) 4.22 - 5.81 MIL/uL   Hemoglobin 7.1 (L) 13.0 - 17.0 g/dL    Comment: Reticulocyte Hemoglobin testing may be clinically indicated, consider ordering this additional test [id number]    HCT 26.8 (L) 39.0 - 52.0 %   MCV 70.2 (L) 80.0 - 100.0 fL   MCH 18.6 (L) 26.0 - 34.0 pg   MCHC 26.5 (L) 30.0 - 36.0 g/dL   RDW 71.0 (H) 62.6 - 94.8 %   Platelets 360 150 - 400 K/uL   nRBC 0.0 0.0 - 0.2 %    Comment: Performed at [facility], [address],  [place], [place] [address]  Lactic acid, plasma     Status: Abnormal   Collection Time: 02/25/23  1:29 AM  Result Value Ref Range   Lactic Acid, Venous 2.6 (HH) 0.5 - 1.9 mmol/L    Comment: CRITICAL RESULT CALLED TO, READ BACK BY AND VERIFIED WITH Dub Amis [id number] Nipper Performed at [facility], [address], [place], [place] [address]   ABO/Rh     Status: None   Collection Time: 02/25/23  8:50 AM  Result Value Ref Range   ABO/RH(D)      O POS Performed at [facility], [address] [place], [place] [address]   Glucose, capillary     Status: Abnormal   Collection Time: 02/25/23 12:41 PM  Result Value Ref Range   Glucose-Capillary 147 (H) 70 - 99 mg/dL    Comment: Glucose reference range applies only to samples taken after fasting for at least 8 hours.    US ARTERIAL ABI (SCREENING LOWER EXTREMITY)  Result Date: 02/25/2023 CLINICAL DATA:  Right second toe redness, swelling, warmth, tenderness, drainage with odor Hypertension Diabetes Hyperlipidemia EXAM: NONINVASIVE PHYSIOLOGIC  VASCULAR STUDY OF BILATERAL LOWER EXTREMITIES TECHNIQUE: Evaluation of both lower extremities were performed at rest, including calculation of ankle-brachial indices with single level pressure measurements and doppler recording. COMPARISON:  None available. FINDINGS: Right ABI:  1.46 Left ABI:  1.41 Right Lower Extremity:  Normal arterial waveforms at the ankle. Left Lower Extremity:  Normal arterial waveforms at the ankle. > 1.4 Non diagnostic secondary to incompressible vessel calcifications (medial arterial sclerosis of [place]) IMPRESSION: Nondiagnostic examination of the lower extremity arteries due to incompressible vessels. If there is continued concern for arterial stenosis, further evaluation with CT angiography would be beneficial. Electronically Signed   By: Acquanetta Belling M.D.   On: 02/25/2023 10:08   CT Angio Chest PE W and/or Wo Contrast  Result Date: 02/24/2023 CLINICAL DATA:  Concern for  pulmonary edema. EXAM: CT ANGIOGRAPHY CHEST WITH CONTRAST TECHNIQUE: Multidetector CT imaging of the chest was performed using the standard protocol during bolus administration of intravenous contrast. Multiplanar CT image reconstructions and MIPs were obtained to evaluate the vascular anatomy. RADIATION DOSE REDUCTION: This exam was performed according to the departmental dose-optimization program which includes automated exposure control, adjustment of the mA and/or kV according to patient size and/or use of iterative reconstruction technique. CONTRAST:  75mL OMNIPAQUE IOHEXOL 350 MG/ML SOLN COMPARISON:  Chest radiograph dated 02/24/2023. FINDINGS: Cardiovascular: There is no cardiomegaly or pericardial effusion. There is 3 vessel coronary vascular calcification. The thoracic aorta is unremarkable. The origins of the great vessels of the aortic arch appear patent. Evaluation of the pulmonary arteries is limited due to respiratory motion and suboptimal opacification and timing of the contrast. No obvious large or central pulmonary artery embolus identified. Mediastinum/Nodes: No hilar or mediastinal adenopathy. The esophagus is grossly unremarkable. No mediastinal fluid collection. Lungs/Pleura: The lungs are clear. There is no pleural effusion pneumothorax. The central airways are patent. Upper Abdomen: No acute abnormality. Musculoskeletal: No acute osseous pathology. Review of the MIP images confirms the above findings. IMPRESSION: 1. No acute intrathoracic pathology. No central pulmonary artery embolus. 2. Coronary vascular calcification. Electronically Signed   By: Elgie Collard M.D.   On: 02/24/2023 19:59   DG Toe 2nd Right  Result Date: 02/24/2023 CLINICAL DATA:  Redness and swelling of the toe EXAM: RIGHT SECOND TOE COMPARISON:  Right second toe radiograph dated 01/31/2020 FINDINGS: Soft tissue edema about the second toe with irregular at the distal aspect. Irregular cortical lucency underlying the  soft tissue wound involving the second toe distal phalanx. There is no evidence of fracture or dislocation. IMPRESSION: Distal second toe soft tissue wound with underlying irregular cortical lucency of the second toe distal phalanx, suspicious for osteomyelitis. Electronically Signed   By: Agustin Cree M.D.   On: 02/24/2023 16:52   DG Chest 2 View  Result Date: 02/24/2023 CLINICAL DATA:  Shortness of breath. EXAM: CHEST - 2 VIEW COMPARISON:  01/07/2023. FINDINGS: Bilateral lung fields are clear. Bilateral costophrenic angles are clear. Normal cardio-mediastinal silhouette. No acute osseous abnormalities. The soft tissues are within normal limits. IMPRESSION: No active cardiopulmonary disease. Electronically Signed   By: Jules Schick M.D.   On: 02/24/2023 14:13     Assessment & Plan:  MIKEY CONGROVE is a 57 y.o. male with palpable pulses bilaterally. ABI is non-0diagnostic due to hardening of vessels but palpable pulse reassuring.  MRI ordered to verify that this is osteomyelitis Discussed that if this is we would recommend amputation and could do that Monday Follow up MRI EGD  today per report  from RN   All questions were answered to the satisfaction of the patient and family.   [name] 02/25/2023, 1:41 PM

## 2023-02-25 NOTE — Assessment & Plan Note (Signed)
-   This is due to cellulitis of the right second toe. - Sepsis manifested by tachycardia and tachypnea. - Management as above.

## 2023-02-25 NOTE — Consult Note (Signed)
Gastroenterology Consult   Referring Provider: No ref. provider found Primary Care Physician:  Elfredia Nevins, MD Primary Gastroenterologist: Gerrit Friends.Rourk, MD  Patient ID: BASCOM STAMLER; [id number]; October 28, 1965   Admit date: 02/24/2023  LOS: 1 day   Date of Consultation: 02/25/2023  Reason for Consultation:  symptomatic anemia  History of Present Illness   ABDULAH SWILLEY is a 57 y.o. year old male with medical history significant for uncontrolled type 2 diabetes, HTN, and HLD as well as recent anemia of unknown cause without any prior workup who presented to the hospital with worsening dyspnea on exertion for 1 month.  ED Course: Vitals -mildly hypertensive.  Tachycardia with heart rate 106, respiratory rate 22. Labs -sodium 134, glucose 432, troponin 2, BNP 27.  Lactate 3.1.  Hemoglobin 7.8 (10.8 in September in our system).  MCV 69.6, platelets 466.  Ferritin 4.  HIV negative. Blood cultures pending  Consult:  Patient reports prior to October he was taking NSAIDs slightly more frequently.  Denies any aspirin powders.  Most recently the last couple days-week he has been trying to take a pillow and only use ibuprofen if Tylenol unavailable.  He was having some shortness of breath even during his prior hospitalization, but most recently has been progressively worse.  A few nights ago he was crawling around the bathroom floor to get to the commode to use the bathroom due to the dizziness and weakness.  He is only taking Pepto a few times in the past for acid reflux symptoms but has had black stool recently even when he has not taken Pepto (only 2 doses).  Denies any significant reflux symptoms but has taken antacids before in the past but none recently.  Today he has undergone ABI -exam was nondiagnostic given lower extremity arteries were incompressible.  Given concern for arterial stenosis, CTA angiography would be beneficial.  Given concerns for osteomyelitis, MRI of his right  second toe was ordered  ED visit to [facility] in October 2024 for dizziness and anemia.  Was also told by urgent that he was in DKA and was advised to come to the ED for evaluation.  At this time patient did report some intermittent occasional black stools.  He states a history of ulcers in the past.  Denies any alcohol use on a continuous basis but does have some occasional drinks.  Was having some occasional epigastric abdominal pain and discomfort.  Was having exertional dyspnea.  Reportedly had a melanotic stool while in the ED.  Hemoglobin was 6.5.  He was advised that he needed transfusion.  Given no GI at [facility] he was transferred to [facility]) for GI services and higher level care.  He denied any previous history of a colonoscopy.  He was treated with PPI and was due to see GI at [facility] however patient apparently signed out AMA given he was dissatisfied with their services.  GI there was going to plan for an upper endoscopy.   Past Medical History:  Diagnosis Date   Diabetes mellitus, type II (HCC)    Hyperlipidemia    Hypertension     Past Surgical History:  Procedure Laterality Date   ELBOW SURGERY     KNEE SURGERY      Prior to Admission medications   Medication Sig Start Date End Date Taking? Authorizing Provider  acetaminophen (TYLENOL) 500 MG tablet Take 1,500 mg by mouth every 6 (six) hours as needed for moderate pain (pain score 4-6).   Yes [provider]  aspirin EC 81 MG tablet Take 81 mg by mouth daily. Swallow whole.   Yes [provider]  atorvastatin (LIPITOR) 40 MG tablet Take 40 mg by mouth daily. 10/02/21  Yes [provider]  bismuth subsalicylate (PEPTO BISMOL) 262 MG/15ML suspension Take 30 mLs by mouth every 6 (six) hours as needed for indigestion.   Yes [provider]  clotrimazole-betamethasone (LOTRISONE) cream Apply 1 Application topically 2 (two) times daily. 11/05/22  Yes [provider]  gabapentin (NEURONTIN)  600 MG tablet Take 600 mg by mouth daily. 07/03/21  Yes [provider]  glipiZIDE (GLUCOTROL XL) 5 MG 24 hr tablet Take 1 tablet (5 mg total) by mouth daily with breakfast. 11/03/21  Yes Nida, Denman George, MD  ibuprofen (ADVIL) 200 MG tablet Take 800 mg by mouth every 6 (six) hours as needed for mild pain (pain score 1-3).   Yes [provider]  Insulin Glargine-Lixisenatide 100-33 UNT-MCG/ML SOPN Inject 60 Units into the skin at bedtime.   Yes [provider]  lisinopril (ZESTRIL) 2.5 MG tablet Take 2.5 mg by mouth daily. 10/27/21  Yes [provider]  meloxicam (MOBIC) 7.5 MG tablet Take 7.5 mg by mouth daily. 05/28/21  Yes [provider]  neomycin-bacitracin-polymyxin (NEOSPORIN) [phone] ointment Apply 1 Application topically in the morning and at bedtime.   Yes [provider]  omeprazole (PRILOSEC) 20 MG capsule Take 20 mg by mouth daily.   Yes [provider]  Blood Glucose Monitoring Suppl (ACCU-CHEK GUIDE ME) w/Device KIT 1 Piece by Does not apply route as directed. 11/03/21   Roma Kayser, MD  Continuous Blood Gluc Receiver (FREESTYLE LIBRE 2 READER) DEVI As directed 11/03/21   Roma Kayser, MD  Continuous Blood Gluc Sensor (FREESTYLE LIBRE 3 SENSOR) MISC 1 Piece by Does not apply route every 14 (fourteen) days. Place 1 sensor on the skin every 14 days. Use to check glucose continuously 11/16/21   Roma Kayser, MD  glucose blood (ACCU-CHEK GUIDE) test strip Use as instructed 11/03/21   Roma Kayser, MD    Current Facility-Administered Medications  Medication Dose Route Frequency Provider Last Rate Last Admin   0.9 %  sodium chloride infusion (Manually program via Guardrails IV Fluids)   Intravenous Once Vassie Loll, MD       0.9 %  sodium chloride infusion   Intravenous Continuous Mansy, Vernetta Honey, MD   Stopped at 02/25/23 0036   0.9 %  sodium chloride infusion   Intravenous Continuous Mansy,  Jan A, MD 150 mL/hr at 02/25/23 0435 New Bag at 02/25/23 0435   acetaminophen (TYLENOL) tablet 650 mg  650 mg Oral Q6H PRN Mansy, Jan A, MD       Or   acetaminophen (TYLENOL) suppository 650 mg  650 mg Rectal Q6H PRN Mansy, Jan A, MD       atorvastatin (LIPITOR) tablet 40 mg  40 mg Oral Daily Mansy, Jan A, MD   40 mg at 02/25/23 0948   bismuth subsalicylate (PEPTO BISMOL) 262 MG/15ML suspension 30 mL  30 mL Oral Q6H PRN Mansy, Jan A, MD       gabapentin (NEURONTIN) capsule 600 mg  600 mg Oral Daily Mansy, Jan A, MD   600 mg at 02/25/23 0948   insulin aspart (novoLOG) injection 0-15 Units  0-15 Units Subcutaneous TID WC Vassie Loll, MD       insulin glargine-yfgn [facility]) injection 60 Units  60 Units Subcutaneous QHS Mansy, Jan  A, MD   60 Units at 02/24/23 2334   lisinopril (ZESTRIL) tablet 2.5 mg  2.5 mg Oral Daily Mansy, Jan A, MD   2.5 mg at 02/25/23 0948   magnesium hydroxide (MILK OF MAGNESIA) suspension 30 mL  30 mL Oral Daily PRN Mansy, Jan A, MD       ondansetron [address] [facility]) tablet 4 mg  4 mg Oral Q6H PRN Mansy, Jan A, MD       Or   ondansetron [facility]) injection 4 mg  4 mg Intravenous Q6H PRN Mansy, Jan A, MD       pantoprazole (PROTONIX) injection 40 mg  40 mg Intravenous Q12H Mansy, Jan A, MD   40 mg at 02/25/23 0949   piperacillin-tazobactam (ZOSYN) IVPB 3.375 g  3.375 g Intravenous Q8H Mansy, Jan A, MD 12.5 mL/hr at 02/25/23 0951 3.375 g at 02/25/23 0951   traZODone (DESYREL) tablet 25 mg  25 mg Oral QHS PRN Mansy, Jan A, MD   25 mg at 02/24/23 2132   vancomycin (VANCOREADY) IVPB 750 mg/150 mL  750 mg Intravenous Q8H Juliette Mangle, RPH   Stopped at 02/25/23 0107    Allergies as of 02/24/2023   (No Known Allergies)    Family History  Problem Relation Age of Onset   Hypertension Mother    Thyroid disease Mother    Diabetes Mother    Hypertension Father    Hyperlipidemia Father     Social History   Socioeconomic History   Marital status: Married    Spouse name: Not on  file   Number of children: Not on file   Years of education: Not on file   Highest education level: Not on file  Occupational History   Not on file  Tobacco Use   Smoking status: Never   Smokeless tobacco: Never  Vaping Use   Vaping status: Never Used  Substance and Sexual Activity   Alcohol use: Never   Drug use: Never   Sexual activity: Not on file  Other Topics Concern   Not on file  Social History Narrative   Not on file   Social Determinants of Health   Financial Resource Strain: Not on file  Food Insecurity: No Food Insecurity (02/24/2023)   Hunger Vital Sign    Worried About Running Out of Food in the Last Year: Never true    Ran Out of Food in the Last Year: Never true  Transportation Needs: No Transportation Needs (02/24/2023)   PRAPARE - Administrator, Civil Service (Medical): No    Lack of Transportation (Non-Medical): No  Physical Activity: Not on file  Stress: No Stress Concern Present (01/08/2023)   Received from [facility] - Occupational Stress Questionnaire    Feeling of Stress : Only a little  Social Connections: Unknown (01/08/2023)   Received from [facility]   Social Network    Social Network: Not on file  Intimate Partner Violence: Not At Risk (02/24/2023)   Humiliation, Afraid, Rape, and Kick questionnaire    Fear of Current or Ex-Partner: No    Emotionally Abused: No    Physically Abused: No    Sexually Abused: No     Review of Systems   Gen: + fatigue. Denies any fever, chills, loss of appetite, weight loss CV: + syncope. Denies chest pain, heart palpitations, edema  Resp: + dyspnea Denies cough, wheezing, coughing up blood, and pleurisy. GI: see HPI  GU : Denies  urinary burning, blood in urine, urinary frequency, and urinary incontinence. MS: Denies joint pain, limitation of movement, swelling, cramps, and atrophy.  Derm: + toe wound. Denies rash, itching, dry skin,  hives. Psych: + anxiety. Denies depression, memory loss, hallucinations, and confusion. Heme: Denies bruising or bleeding Neuro:  + dizziness. Denies any headaches, paresthesias, shaking  Physical Exam   Vital Signs in last 24 hours: Temp:  [97.5 F (36.4 C)-98.5 F (36.9 C)] 97.5 F (36.4 C) (12/06 0618) Pulse Rate:  [71-106] 71 (12/06 0618) Resp:  [17-22] 18 (12/06 0618) BP: (119-148)/(62-79) 128/69 (12/06 0618) SpO2:  [94 %-100 %] 95 % (12/06 0618) Weight:  [116.1 kg-119.4 kg] 119.4 kg (12/05 2230)   General:   Alert,  well-nourished, pleasant and cooperative in NAD Head:  Normocephalic and atraumatic. Eyes:  Sclera clear, no icterus.   Conjunctiva pink. Ears:  Normal auditory acuity. Mouth:  No deformity or lesions, dentition normal. Neck:  Supple; no masses Lungs:  Clear throughout to auscultation.   No wheezes, crackles, or rhonchi. No acute distress. Heart:  Regular rate and rhythm; no murmurs, clicks, rubs,  or gallops. Abdomen:  Soft, nontender and nondistended. No masses, hepatosplenomegaly or hernias noted. Normal bowel sounds, without guarding, and without rebound.   Rectal: deferred   Msk:  Symmetrical without gross deformities. Normal posture. Extremities:  Without clubbing or edema. Would to right second toe not observed.  Neurologic:  Alert and oriented x4. Skin:  Wound not observed.  Psych:  Alert and cooperative. Normal mood and affect.  Intake/Output from previous day: 12/05 0701 - 12/06 0700 In: 1501.8 [I.V.:201.2; IV Piggyback:1300.6] Out: -  Intake/Output this shift: No intake/output data recorded.   Labs/Studies   Recent Labs Recent Labs    02/24/23 1301 02/25/23 0129  WBC 8.9 7.3  HGB 7.8* 7.1*  HCT 27.5* 26.8*  PLT 466* 360   BMET Recent Labs    02/24/23 1301 02/25/23 0129  NA 134* 137  K 3.9 3.7  CL 101 106  CO2 20* 20*  GLUCOSE 432* 326*  BUN 19 17  CREATININE 1.00 0.84  CALCIUM 8.9 8.5*   LFT Recent Labs     02/24/23 1301  PROT 7.4  ALBUMIN 3.6  AST 13*  ALT 13  ALKPHOS 74  BILITOT 0.2   PT/INR Recent Labs    02/24/23 2317  LABPROT 13.5  INR 1.0   Hepatitis Panel No results for input(s): "HEPBSAG", "HCVAB", "HEPAIGM", "HEPBIGM" in the last 72 hours. C-Diff No results for input(s): "CDIFFTOX" in the last 72 hours.  Radiology/Studies CT Angio Chest PE W and/or Wo Contrast  Result Date: 02/24/2023 CLINICAL DATA:  Concern for pulmonary edema. EXAM: CT ANGIOGRAPHY CHEST WITH CONTRAST TECHNIQUE: Multidetector CT imaging of the chest was performed using the standard protocol during bolus administration of intravenous contrast. Multiplanar CT image reconstructions and MIPs were obtained to evaluate the vascular anatomy. RADIATION DOSE REDUCTION: This exam was performed according to the departmental dose-optimization program which includes automated exposure control, adjustment of the mA and/or kV according to patient size and/or use of iterative reconstruction technique. CONTRAST:  75mL OMNIPAQUE IOHEXOL 350 MG/ML SOLN COMPARISON:  Chest radiograph dated 02/24/2023. FINDINGS: Cardiovascular: There is no cardiomegaly or pericardial effusion. There is 3 vessel coronary vascular calcification. The thoracic aorta is unremarkable. The origins of the great vessels of the aortic arch appear patent. Evaluation of the pulmonary arteries is limited due to respiratory motion and suboptimal opacification and timing of the contrast. No obvious large or  central pulmonary artery embolus identified. Mediastinum/Nodes: No hilar or mediastinal adenopathy. The esophagus is grossly unremarkable. No mediastinal fluid collection. Lungs/Pleura: The lungs are clear. There is no pleural effusion pneumothorax. The central airways are patent. Upper Abdomen: No acute abnormality. Musculoskeletal: No acute osseous pathology. Review of the MIP images confirms the above findings. IMPRESSION: 1. No acute intrathoracic pathology. No  central pulmonary artery embolus. 2. Coronary vascular calcification. Electronically Signed   By: Elgie Collard M.D.   On: 02/24/2023 19:59   DG Toe 2nd Right  Result Date: 02/24/2023 CLINICAL DATA:  Redness and swelling of the toe EXAM: RIGHT SECOND TOE COMPARISON:  Right second toe radiograph dated 01/31/2020 FINDINGS: Soft tissue edema about the second toe with irregular at the distal aspect. Irregular cortical lucency underlying the soft tissue wound involving the second toe distal phalanx. There is no evidence of fracture or dislocation. IMPRESSION: Distal second toe soft tissue wound with underlying irregular cortical lucency of the second toe distal phalanx, suspicious for osteomyelitis. Electronically Signed   By: Agustin Cree M.D.   On: 02/24/2023 16:52   DG Chest 2 View  Result Date: 02/24/2023 CLINICAL DATA:  Shortness of breath. EXAM: CHEST - 2 VIEW COMPARISON:  01/07/2023. FINDINGS: Bilateral lung fields are clear. Bilateral costophrenic angles are clear. Normal cardio-mediastinal silhouette. No acute osseous abnormalities. The soft tissues are within normal limits. IMPRESSION: No active cardiopulmonary disease. Electronically Signed   By: Jules Schick M.D.   On: 02/24/2023 14:13     Assessment   MARQUEL DENT is a 57 y.o. year old male with medical history significant for uncontrolled type 2 diabetes, HTN, and HLD as well as recent anemia of unknown cause without any prior workup who presented to the hospital with worsening dyspnea on exertion for 1 month.  Iron deficiency anemia: Hemoglobin 7.8 on admission, dropped from 10.8 about 3 months ago in our system.  Has orders to receive 1 unit of blood. He did have an ED visit to [facility] in October for DKA, dizziness, and anemia in which he received blood transfusion and was transferred to [facility] for GI workup however he ultimately left AMA given he was dissatisfied with services.  He continues to have issues with osteomyelitis  without improvement with antibiotics, MRI pending.  He does have history of prior NSAID use, but nothing frequently within the last couple of months.  Has been avoiding any Aleve, Advil, BC or Goody powders.  Does have some history of some melanotic stools especially during his hospitalization in October for anemia where he required blood transfusion.  Does admit to some occasional ibuprofen use the other day for pain but only because he did not have any Tylenol with him.  Non-smoker and denies any regular alcohol use.  Not currently with any reflux symptoms either and not currently on PPI.  Given ongoing dizziness and lightheadedness as well as dyspnea this admission we will proceed with upper endoscopy for further evaluation.  Blood sugar stable this afternoon.  Differentials include gastritis, duodenitis, peptic ulcer disease, AVMs, portal gastropathy, unable to exclude malignancy although less likely.  If EGD unrevealing he will need colonoscopy however this can be completed outpatient.   Constipation: Has been experiencing some mild constipation.  Last bowel movement was 3-4 days ago.  Has some urgency and abdominal cramping with need to go but then unable to go when he gets to bathroom.  Has tried MiraLAX at home with good results in the past.  Will start  MiraLAX once daily.   Plan / Recommendations   PPI once daily.  Trend H/H, transfuse as needed for Hgb <7 EGD today NPO Consider daily iron therapy on discharge. Start MiraLAX 17 g once daily. Outpatient follow up on discharge for colonoscopy.     02/25/2023, 10:01 AM  Brooke Bonito, MSN, FNP-BC, AGACNP-BC [facility]

## 2023-02-25 NOTE — Progress Notes (Signed)
   02/25/23 0200  Provider Notification  Provider Name/Title Valente David, MD  Date Provider Notified 02/25/23  Time Provider Notified 0200  Method of Notification Page  Notification Reason Critical Result  Test performed and critical result Lactic Acid 2.6  Date Critical Result Received 02/25/23  Time Critical Result Received 0200  Provider response No new orders

## 2023-02-25 NOTE — Progress Notes (Signed)
   02/25/23 1439  TOC Brief Assessment  Insurance and Status Reviewed  Patient has primary care physician Yes  Home environment has been reviewed Private with spouse  Prior level of function: Independent  Prior/Current Home Services No current home services  Social Determinants of Health Reivew SDOH reviewed no interventions necessary  Readmission risk has been reviewed Yes  Transition of care needs no transition of care needs at this time

## 2023-02-25 NOTE — Op Note (Signed)
[facility] Patient Name: Kevin Strong Procedure Date: 02/25/2023 6:03 PM MRN: [id number] Date of Birth: [birth year] Attending MD: Gennette Pac , MD, [id number] CSN: [id number] Age: 57 Admit Type: Inpatient Procedure:                Upper GI endoscopy Indications:              Melena Providers:                Gennette Pac, MD, Nena Polio, RN, Cyril Mourning, Technician Referring MD:              Medicines:                Propofol per Anesthesia Complications:            No immediate complications. Estimated Blood Loss:     Estimated blood loss was minimal. Procedure:                Pre-Anesthesia Assessment:                           - Prior to the procedure, a History and Physical                            was performed, and patient medications and                            allergies were reviewed. The patient's tolerance of                            previous anesthesia was also reviewed. The risks                            and benefits of the procedure and the sedation                            options and risks were discussed with the patient.                            All questions were answered, and informed consent                            was obtained. Prior Anticoagulants: The patient has                            taken no anticoagulant or antiplatelet agents. ASA                            Grade Assessment: III - A patient with severe                            systemic disease. After reviewing the risks and  benefits, the patient was deemed in satisfactory                            condition to undergo the procedure.                           After obtaining informed consent, the endoscope was                            passed under direct vision. Throughout the                            procedure, the patient's blood pressure, pulse, and                            oxygen saturations  were monitored continuously. The                            [id number]-H190 ([id number]) scope was introduced through the                            mouth, and advanced to the second part of duodenum.                            The upper GI endoscopy was accomplished without                            difficulty. The patient tolerated the procedure                            well. Scope In: 6:20:41 PM Scope Out: 6:26:44 PM Total Procedure Duration: 0 hours 6 minutes 3 seconds  Findings:      The examined esophagus was normal aside from a noncritical Schatzki's       ring. Stomach empty. Small hiatal hernia normal-appearing gastric       mucosa. Patent pylorus.      A small hiatal hernia was present.      Examination of bulb second portion revealed numerous (at least 8)       superficially cratered erosions measuring up to 10 mm. No active       bleeding no bleeding stigmata. Biopsies the gastric mucosa taken to       assess for H. pylori Impression:               - Noncritical Schatzki's ring otherwise normal                            esophagus.                           - Small hiatal hernia; otherwise, normal stomach..                           -Numerous bulbar and second portion duodenal  erosions?"likely source of blood loss(NSAID insult).                            Status post gastric biopsies to assess for H. pylori Moderate Sedation:      Moderate (conscious) sedation was personally administered by an       anesthesia professional. The following parameters were monitored: oxygen       saturation, heart rate, blood pressure, respiratory rate, EKG, adequacy       of pulmonary ventilation, and response to care. Recommendation:           - Patient has a contact number available for                            emergencies. The signs and symptoms of potential                            delayed complications were discussed with the                            patient.  Return to normal activities tomorrow.                            Written discharge instructions were provided to the                            patient.                           - Return patient to hospital ward for ongoing care.                           - Advance diet as tolerated. Twice daily Protonix                            40 mg. Avoid all NSAIDs. Follow-up on pathology.                            Outpatient colonoscopy on an elective basis. Procedure Code(s):        --- Professional ---                           [phone], Esophagogastroduodenoscopy, flexible,                            transoral; diagnostic, including collection of                            specimen(s) by brushing or washing, when performed                            (separate procedure) Diagnosis Code(s):        --- Professional ---                           K44.9, Diaphragmatic hernia without obstruction or  gangrene                           K92.1, Melena (includes Hematochezia) CPT copyright 2022 American Medical Association. All rights reserved. The codes documented in this report are preliminary and upon coder review may  be revised to meet current compliance requirements. Gerrit Friends. Demere Dotzler, MD Gennette Pac, MD 02/25/2023 7:03:36 PM This report has been signed electronically. Number of Addenda: 0

## 2023-02-25 NOTE — Transfer of Care (Signed)
Immediate Anesthesia Transfer of Care Note  Patient: Kevin Strong  Procedure(s) Performed: ESOPHAGOGASTRODUODENOSCOPY (EGD) WITH PROPOFOL BIOPSY  Patient Location: PACU  Anesthesia Type:General  Level of Consciousness: awake and alert   Airway & Oxygen Therapy: Patient Spontanous Breathing  Post-op Assessment: Report given to RN  Post vital signs: Reviewed and stable  Last Vitals:  Vitals Value Taken Time  BP 110/59 02/25/23 1830  Temp 37.5 C 02/25/23 1830  Pulse 78 02/25/23 1835  Resp 26 02/25/23 1835  SpO2 93 % 02/25/23 1835  Vitals shown include unfiled device data.  Last Pain:  Vitals:   02/25/23 1830  TempSrc:   PainSc: Asleep         Complications: No notable events documented.

## 2023-02-25 NOTE — Inpatient Diabetes Management (Addendum)
Inpatient Diabetes Program Recommendations  AACE/ADA: New Consensus Statement on Inpatient Glycemic Control (2015)  Target Ranges:  Prepandial:   less than 140 mg/dL      Peak postprandial:   less than 180 mg/dL (1-2 hours)      Critically ill patients:  140 - 180 mg/dL   Lab Results  Component Value Date   GLUCAP 323 (H) 02/24/2023   HGBA1C 10.0 10/09/2021    Review of Glycemic Control  Latest Reference Range & Units 02/24/23 17:48 02/24/23 19:46 02/24/23 23:32  Glucose-Capillary 70 - 99 mg/dL 846 (H) 962 (H) 952 (H)    Diabetes history: DM 2 Outpatient Diabetes medications: Glipizide 5 mg Daily, glargine-lixisenatide 60 Daily, trulicity 3 mg Daily Current orders for Inpatient glycemic control:  Novolog 0-15 units tid Semglee 60 units qhs  Inpatient Diabetes Program Recommendations:    -   Start Novolog 5 units tid meal coverage if eating >50% of meals  -   Increase Semglee to 70 units   Thanks,  Christena Deem RN, MSN, BC-ADM Inpatient Diabetes Coordinator Team Pager [phone] (8a-5p)

## 2023-02-25 NOTE — Anesthesia Postprocedure Evaluation (Signed)
Anesthesia Post Note  Patient: Kevin Strong  Procedure(s) Performed: ESOPHAGOGASTRODUODENOSCOPY (EGD) WITH PROPOFOL BIOPSY  Patient location during evaluation: PACU Anesthesia Type: General Level of consciousness: awake and alert Pain management: pain level controlled Vital Signs Assessment: post-procedure vital signs reviewed and stable Respiratory status: spontaneous breathing, nonlabored ventilation, respiratory function stable and patient connected to nasal cannula oxygen Cardiovascular status: blood pressure returned to baseline and stable Postop Assessment: no apparent nausea or vomiting Anesthetic complications: no   No notable events documented.   Last Vitals:  Vitals:   02/25/23 1620 02/25/23 1830  BP: 127/75   Pulse: 76 77  Resp: 20 (!) 28  Temp: 37.1 C 37.5 C  SpO2: 100% 93%    Last Pain:  Vitals:   02/25/23 1830  TempSrc:   PainSc: Asleep                 Windell Norfolk

## 2023-02-25 NOTE — Anesthesia Preprocedure Evaluation (Signed)
Anesthesia Evaluation  Patient identified by MRN, date of birth, ID band Patient awake    Reviewed: Allergy & Precautions, H&P , NPO status , Patient's Chart, lab work & pertinent test results, reviewed documented beta blocker date and time   Airway Mallampati: II  TM Distance: >3 FB Neck ROM: full    Dental no notable dental hx.    Pulmonary neg pulmonary ROS   Pulmonary exam normal breath sounds clear to auscultation       Cardiovascular Exercise Tolerance: Good hypertension,  Rhythm:regular Rate:Normal     Neuro/Psych negative neurological ROS  negative psych ROS   GI/Hepatic Neg liver ROS,GERD  ,,  Endo/Other  diabetes    Renal/GU negative Renal ROS  negative genitourinary   Musculoskeletal   Abdominal   Peds  Hematology  (+) Blood dyscrasia, anemia   Anesthesia Other Findings   Reproductive/Obstetrics negative OB ROS                             Anesthesia Physical Anesthesia Plan  ASA: 2 and emergent  Anesthesia Plan: General   Post-op Pain Management:    Induction:   PONV Risk Score and Plan: Propofol infusion  Airway Management Planned:   Additional Equipment:   Intra-op Plan:   Post-operative Plan:   Informed Consent: I have reviewed the patients History and Physical, chart, labs and discussed the procedure including the risks, benefits and alternatives for the proposed anesthesia with the patient or authorized representative who has indicated his/her understanding and acceptance.     Dental Advisory Given  Plan Discussed with: CRNA  Anesthesia Plan Comments:        Anesthesia Quick Evaluation

## 2023-02-25 NOTE — Progress Notes (Signed)
Patient ID: Kevin Strong, male   DOB: Jan 23, 1966, 57 y.o.   MRN: [id number]  BP 128/69 (BP Location: Right Arm)   Pulse 71   Temp (!) 97.5 F (36.4 C) (Oral)   Resp 18   Ht 6' (1.829 m)   Wt 119.4 kg   SpO2 95%   BMI 35.52 kg/m    57 year old male diabetic with osteomyelitis of the distal phalanx of the second digit of his right foot  My recommendations is that general surgery evaluate for possible toe amputation if toe amputation not felt to be necessary then treat for osteomyelitis and see foot and ankle Dr. Lajoyce Corners as an outpatient

## 2023-02-25 NOTE — Consult Note (Signed)
[facility] Consult  Reason for Consult: Right 2nd toe  Referring Physician: Dr. Gwenlyn Perking  Chief Complaint   Shortness of Breath     HPI: Kevin Strong is a 57 y.o. male with new onset SOB and anemia that was seen in the ED and admitted. He also has a 2nd toe that has been draining and had a CT in 11/2022 that showed soft tissue infection. This has not resolved. He has tried to soak it some but says the toe hurts so bad that he has to put it in hot water to get comfort. He is a diabetic and says he wears boots. He knows of no injury to the area.   Past Medical History:  Diagnosis Date   Diabetes mellitus, type II (HCC)    Hyperlipidemia    Hypertension     Past Surgical History:  Procedure Laterality Date   ELBOW SURGERY     KNEE SURGERY      Family History  Problem Relation Age of Onset   Hypertension Mother    Thyroid disease Mother    Diabetes Mother    Hypertension Father    Hyperlipidemia Father     Social History   Tobacco Use   Smoking status: Never   Smokeless tobacco: Never  Vaping Use   Vaping status: Never Used  Substance Use Topics   Alcohol use: Never   Drug use: Never    Medications: I have reviewed the patient's current medications. Prior to Admission:  Medications Prior to Admission  Medication Sig Dispense Refill Last Dose   acetaminophen (TYLENOL) 500 MG tablet Take 1,500 mg by mouth every 6 (six) hours as needed for moderate pain (pain score 4-6).   02/23/2023   aspirin EC 81 MG tablet Take 81 mg by mouth daily. Swallow whole.   02/24/2023 at 0700   atorvastatin (LIPITOR) 40 MG tablet Take 40 mg by mouth daily.   02/24/2023   bismuth subsalicylate (PEPTO BISMOL) 262 MG/15ML suspension Take 30 mLs by mouth every 6 (six) hours as needed for indigestion.   Past Week   clotrimazole-betamethasone (LOTRISONE) cream Apply 1 Application topically 2 (two) times daily.   Past Week   gabapentin (NEURONTIN) 600 MG tablet Take 600 mg by  mouth daily.   02/24/2023   glipiZIDE (GLUCOTROL XL) 5 MG 24 hr tablet Take 1 tablet (5 mg total) by mouth daily with breakfast. 90 tablet 1 02/24/2023   ibuprofen (ADVIL) 200 MG tablet Take 800 mg by mouth every 6 (six) hours as needed for mild pain (pain score 1-3).   02/24/2023   Insulin Glargine-Lixisenatide 100-33 UNT-MCG/ML SOPN Inject 60 Units into the skin at bedtime.   02/23/2023   lisinopril (ZESTRIL) 2.5 MG tablet Take 2.5 mg by mouth daily.   02/24/2023   meloxicam (MOBIC) 7.5 MG tablet Take 7.5 mg by mouth daily.   02/24/2023   neomycin-bacitracin-polymyxin (NEOSPORIN) [phone] ointment Apply 1 Application topically in the morning and at bedtime.   02/24/2023   omeprazole (PRILOSEC) 20 MG capsule Take 20 mg by mouth daily.   02/24/2023   Blood Glucose Monitoring Suppl (ACCU-CHEK GUIDE ME) w/Device KIT 1 Piece by Does not apply route as directed. 1 kit 0    Continuous Blood Gluc Receiver (FREESTYLE LIBRE 2 READER) DEVI As directed 1 each 0    Continuous Blood Gluc Sensor (FREESTYLE LIBRE 3 SENSOR) MISC 1 Piece by Does not apply route every 14 (fourteen) days. Place 1 sensor on the skin every  14 days. Use to check glucose continuously 2 each 2    glucose blood (ACCU-CHEK GUIDE) test strip Use as instructed 150 each 2    Scheduled:  atorvastatin  40 mg Oral Daily   gabapentin  600 mg Oral Daily   insulin aspart  0-15 Units Subcutaneous TID WC   insulin glargine-yfgn  60 Units Subcutaneous QHS   lisinopril  2.5 mg Oral Daily   pantoprazole (PROTONIX) IV  40 mg Intravenous Q12H   Continuous:  sodium chloride Stopped (02/25/23 0036)   sodium chloride 150 mL/hr at 02/25/23 0435   piperacillin-tazobactam 3.375 g (02/25/23 0951)   vancomycin 750 mg (02/25/23 1004)   UEA:[id number] **OR** acetaminophen, bismuth subsalicylate, magnesium hydroxide, ondansetron **OR** ondansetron (ZOFRAN) IV, traZODone  Not on File   ROS:  A comprehensive review of systems was negative except for:  Respiratory: positive for SOB Hematologic/lymphatic: positive for anemia Musculoskeletal: positive for right 2nd toe pain  Blood pressure 135/79, pulse 80, temperature 98.5 F (36.9 C), temperature source Axillary, resp. rate 19, height 6' (1.829 m), weight 119.4 kg, SpO2 100%. Physical Exam Vitals reviewed.  HENT:     Head: Normocephalic.  Cardiovascular:     Rate and Rhythm: Normal rate.     Pulses:          Dorsalis pedis pulses are 2+ on the right side and 2+ on the left side.       Posterior tibial pulses are 2+ on the right side and 2+ on the left side.     Comments: Warm feet and palpable pulses  Pulmonary:     Effort: Pulmonary effort is normal.  Abdominal:     Palpations: Abdomen is soft.  Musculoskeletal:     Cervical back: Normal range of motion.     Comments: Right second toe with edema and discolored skin slightly dusky, no drainage   Neurological:     General: No focal deficit present.     Mental Status: He is alert and oriented to person, place, and time.  Psychiatric:        Mood and Affect: Mood normal.        Behavior: Behavior normal.        Results: Results for orders placed or performed during the hospital encounter of 02/24/23 (from the past 48 hour(s))  Resp panel by RT-PCR (RSV, Flu A&B, Covid) Anterior Nasal Swab     Status: None   Collection Time: 02/24/23 12:14 PM   Specimen: Anterior Nasal Swab  Result Value Ref Range   SARS Coronavirus 2 by RT PCR NEGATIVE NEGATIVE    Comment: (NOTE) SARS-CoV-2 target nucleic acids are NOT DETECTED.  The SARS-CoV-2 RNA is generally detectable in upper respiratory specimens during the acute phase of infection. The lowest concentration of SARS-CoV-2 viral copies this assay can detect is 138 copies/mL. A negative result does not preclude SARS-Cov-2 infection and should not be used as the sole basis for treatment or other patient management decisions. A negative result may occur with  improper specimen  collection/handling, submission of specimen other than nasopharyngeal swab, presence of viral mutation(s) within the areas targeted by this assay, and inadequate number of viral copies(<138 copies/mL). A negative result must be combined with clinical observations, patient history, and epidemiological information. The expected result is Negative.  Fact Sheet for Patients:  BloggerCourse.com  Fact Sheet for Healthcare Providers:  SeriousBroker.it  This test is no t yet approved or cleared by the [place] and  has been authorized  for detection and/or diagnosis of SARS-CoV-2 by FDA under an Emergency Use Authorization (EUA). This EUA will remain  in effect (meaning this test can be used) for the duration of the COVID-19 declaration under Section 564(b)(1) of the Act, 21 U.S.C.section 360bbb-3(b)(1), unless the authorization is terminated  or revoked sooner.       Influenza A by PCR NEGATIVE NEGATIVE   Influenza B by PCR NEGATIVE NEGATIVE    Comment: (NOTE) The Xpert Xpress SARS-CoV-2/FLU/RSV plus assay is intended as an aid in the diagnosis of influenza from Nasopharyngeal swab specimens and should not be used as a sole basis for treatment. Nasal washings and aspirates are unacceptable for Xpert Xpress SARS-CoV-2/FLU/RSV testing.  Fact Sheet for Patients: BloggerCourse.com  Fact Sheet for Healthcare Providers: SeriousBroker.it  This test is not yet approved or cleared by the [place] FDA and has been authorized for detection and/or diagnosis of SARS-CoV-2 by FDA under an Emergency Use Authorization (EUA). This EUA will remain in effect (meaning this test can be used) for the duration of the COVID-19 declaration under Section 564(b)(1) of the Act, 21 U.S.C. section 360bbb-3(b)(1), unless the authorization is terminated or revoked.     Resp Syncytial Virus by PCR  NEGATIVE NEGATIVE    Comment: (NOTE) Fact Sheet for Patients: BloggerCourse.com  Fact Sheet for Healthcare Providers: SeriousBroker.it  This test is not yet approved or cleared by the [place] FDA and has been authorized for detection and/or diagnosis of SARS-CoV-2 by FDA under an Emergency Use Authorization (EUA). This EUA will remain in effect (meaning this test can be used) for the duration of the COVID-19 declaration under Section 564(b)(1) of the Act, 21 U.S.C. section 360bbb-3(b)(1), unless the authorization is terminated or revoked.  Performed at [facility], [address] [place], [place] [address]   Lactic acid, plasma     Status: Abnormal   Collection Time: 02/24/23  1:01 PM  Result Value Ref Range   Lactic Acid, Venous 3.1 (HH) 0.5 - 1.9 mmol/L    Comment: CRITICAL RESULT CALLED TO, READ BACK BY AND VERIFIED WITH B GREENWOOD RN [phone] K FORSYTH Performed at [facility], [address] [place], [place] [address]   CBC with Differential     Status: Abnormal   Collection Time: 02/24/23  1:01 PM  Result Value Ref Range   WBC 8.9 4.0 - 10.5 K/uL   RBC 3.95 (L) 4.22 - 5.81 MIL/uL   Hemoglobin 7.8 (L) 13.0 - 17.0 g/dL    Comment: Reticulocyte Hemoglobin testing may be clinically indicated, consider ordering this additional test [id number]    HCT 27.5 (L) 39.0 - 52.0 %   MCV 69.6 (L) 80.0 - 100.0 fL   MCH 19.7 (L) 26.0 - 34.0 pg   MCHC 28.4 (L) 30.0 - 36.0 g/dL   RDW 46.9 (H) 62.9 - 52.8 %   Platelets 466 (H) 150 - 400 K/uL   nRBC 0.0 0.0 - 0.2 %   Neutrophils Relative % 60 %   Neutro Abs 5.2 1.7 - 7.7 K/uL   Lymphocytes Relative 28 %   Lymphs Abs 2.5 0.7 - 4.0 K/uL   Monocytes Relative 10 %   Monocytes Absolute 0.9 0.1 - 1.0 K/uL   Eosinophils Relative 1 %   Eosinophils Absolute 0.1 0.0 - 0.5 K/uL   Basophils Relative 1 %   Basophils Absolute 0.1 0.0 - 0.1 K/uL   WBC Morphology MORPHOLOGY UNREMARKABLE     RBC Morphology See Note  Comment: MICROCYTOSIS, ANISOCYTOSIS, HYPOCHROMASIA   Smear Review MORPHOLOGY UNREMARKABLE    Immature Granulocytes 0 %   Abs Immature Granulocytes 0.03 0.00 - 0.07 K/uL   Polychromasia PRESENT    Target Cells PRESENT    Ovalocytes PRESENT     Comment: Performed at [facility], [address], [place], [place] [address]  Comprehensive metabolic panel     Status: Abnormal   Collection Time: 02/24/23  1:01 PM  Result Value Ref Range   Sodium 134 (L) 135 - 145 mmol/L   Potassium 3.9 3.5 - 5.1 mmol/L   Chloride 101 98 - 111 mmol/L   CO2 20 (L) 22 - 32 mmol/L   Glucose, Bld 432 (H) 70 - 99 mg/dL    Comment: Glucose reference range applies only to samples taken after fasting for at least 8 hours.   BUN 19 6 - 20 mg/dL   Creatinine, Ser 6.04 0.61 - 1.24 mg/dL   Calcium 8.9 8.9 - 54.0 mg/dL   Total Protein 7.4 6.5 - 8.1 g/dL   Albumin 3.6 3.5 - 5.0 g/dL   AST 13 (L) 15 - 41 U/L   ALT 13 0 - 44 U/L   Alkaline Phosphatase 74 38 - 126 U/L   Total Bilirubin 0.2 <1.2 mg/dL   GFR, Estimated >98 >11 mL/min    Comment: (NOTE) Calculated using the CKD-EPI Creatinine Equation (2021)    Anion gap 13 5 - 15    Comment: Performed at [facility], [address], [place], [place] [address]  Troponin I (High Sensitivity)     Status: None   Collection Time: 02/24/23  1:01 PM  Result Value Ref Range   Troponin I (High Sensitivity) 2 <18 ng/L    Comment: (NOTE) Elevated high sensitivity troponin I (hsTnI) values and significant  changes across serial measurements may suggest ACS but many other  chronic and acute conditions are known to elevate hsTnI results.  Refer to the "Links" section for chest pain algorithms and additional  guidance. Performed at [facility], [address] [place], [place] [address]   Brain natriuretic peptide     Status: None   Collection Time: 02/24/23  1:01 PM  Result Value Ref Range   B Natriuretic Peptide 27.0 0.0 - 100.0 pg/mL     Comment: Performed at [facility], [address] [place], [place] [address]  Magnesium     Status: None   Collection Time: 02/24/23  3:15 PM  Result Value Ref Range   Magnesium 2.2 1.7 - 2.4 mg/dL    Comment: Performed at [facility], [address] [place], [place] [address]  Troponin I (High Sensitivity)     Status: None   Collection Time: 02/24/23  3:17 PM  Result Value Ref Range   Troponin I (High Sensitivity) 2 <18 ng/L    Comment: (NOTE) Elevated high sensitivity troponin I (hsTnI) values and significant  changes across serial measurements may suggest ACS but many other  chronic and acute conditions are known to elevate hsTnI results.  Refer to the "Links" section for chest pain algorithms and additional  guidance. Performed at [facility], [address] [place], [place] [address]   Type and screen [facility]     Status: None (Preliminary result)   Collection Time: 02/24/23  3:17 PM  Result Value Ref Range   ABO/RH(D) O POS    Antibody Screen NEG    Sample Expiration 02/27/2023,2359    Unit Number [id number]    Blood  Component Type RED CELLS,LR    Unit division 00    Status of Unit ISSUED    Transfusion Status OK TO TRANSFUSE    Crossmatch Result      Compatible Performed at [facility], [address], [place], [place] [address]   Prepare RBC (crossmatch)     Status: None   Collection Time: 02/24/23  3:17 PM  Result Value Ref Range   Order Confirmation      ORDER PROCESSED BY BLOOD BANK Performed at [facility], [address] [place], [place] [address]   POC CBG, ED     Status: Abnormal   Collection Time: 02/24/23  5:48 PM  Result Value Ref Range   Glucose-Capillary 294 (H) 70 - 99 mg/dL    Comment: Glucose reference range applies only to samples taken after fasting for at least 8 hours.  Blood Cultures x 2 sites     Status: None (Preliminary result)   Collection Time: 02/24/23  6:29 PM   Specimen: BLOOD  Result Value Ref Range   Specimen  Description BLOOD BLOOD RIGHT HAND    Special Requests      BOTTLES DRAWN AEROBIC AND ANAEROBIC Blood Culture adequate volume   Culture      NO GROWTH < 12 HOURS Performed at [facility], [address], [place], [place] [address]    Report Status PENDING   Blood Cultures x 2 sites     Status: None (Preliminary result)   Collection Time: 02/24/23  6:29 PM   Specimen: BLOOD  Result Value Ref Range   Specimen Description BLOOD LEFT ANTECUBITAL    Special Requests      BOTTLES DRAWN AEROBIC AND ANAEROBIC Blood Culture results may not be optimal due to an inadequate volume of blood received in culture bottles   Culture      NO GROWTH < 12 HOURS Performed at [facility], [address], [place], [place] [address]    Report Status PENDING   Ferritin (Iron Binding Protein)     Status: Abnormal   Collection Time: 02/24/23  6:29 PM  Result Value Ref Range   Ferritin 4 (L) 24 - 336 ng/mL    Comment: Performed at [facility], [address] [place], [place] [address]  CBG monitoring, ED     Status: Abnormal   Collection Time: 02/24/23  7:46 PM  Result Value Ref Range   Glucose-Capillary 261 (H) 70 - 99 mg/dL    Comment: Glucose reference range applies only to samples taken after fasting for at least 8 hours.  HIV Antibody (routine testing w rflx)     Status: None   Collection Time: 02/24/23 11:17 PM  Result Value Ref Range   HIV Screen 4th Generation wRfx Non Reactive Non Reactive    Comment: Performed at [facility], [address] [place], [place] [address]  Lactic acid, plasma     Status: Abnormal   Collection Time: 02/24/23 11:17 PM  Result Value Ref Range   Lactic Acid, Venous 2.1 (HH) 0.5 - 1.9 mmol/L    Comment: CRITICAL RESULT CALLED TO, READ BACK BY AND VERIFIED WITH D. JONES AT 0016 ON 12.06.24 BY ADGER J Performed at [facility], [address], [place], [place] [address]   Protime-INR     Status: None   Collection Time: 02/24/23 11:17 PM  Result Value Ref Range    Prothrombin Time 13.5 11.4 - 15.2 seconds   INR 1.0 0.8 - 1.2  Comment: (NOTE) INR goal varies based on device and disease states. Performed at [facility], [address] [place], [place] [address]   APTT     Status: None   Collection Time: 02/24/23 11:17 PM  Result Value Ref Range   aPTT 25 24 - 36 seconds    Comment: Performed at [facility], [address], [place], [place] [address]  Glucose, capillary     Status: Abnormal   Collection Time: 02/24/23 11:32 PM  Result Value Ref Range   Glucose-Capillary 323 (H) 70 - 99 mg/dL    Comment: Glucose reference range applies only to samples taken after fasting for at least 8 hours.  Basic metabolic panel     Status: Abnormal   Collection Time: 02/25/23  1:29 AM  Result Value Ref Range   Sodium 137 135 - 145 mmol/L   Potassium 3.7 3.5 - 5.1 mmol/L   Chloride 106 98 - 111 mmol/L   CO2 20 (L) 22 - 32 mmol/L   Glucose, Bld 326 (H) 70 - 99 mg/dL    Comment: Glucose reference range applies only to samples taken after fasting for at least 8 hours.   BUN 17 6 - 20 mg/dL   Creatinine, Ser 7.06 0.61 - 1.24 mg/dL   Calcium 8.5 (L) 8.9 - 10.3 mg/dL   GFR, Estimated >23 >76 mL/min    Comment: (NOTE) Calculated using the CKD-EPI Creatinine Equation (2021)    Anion gap 11 5 - 15    Comment: Performed at [facility], [address], [place], [place] [address]  CBC     Status: Abnormal   Collection Time: 02/25/23  1:29 AM  Result Value Ref Range   WBC 7.3 4.0 - 10.5 K/uL   RBC 3.82 (L) 4.22 - 5.81 MIL/uL   Hemoglobin 7.1 (L) 13.0 - 17.0 g/dL    Comment: Reticulocyte Hemoglobin testing may be clinically indicated, consider ordering this additional test [id number]    HCT 26.8 (L) 39.0 - 52.0 %   MCV 70.2 (L) 80.0 - 100.0 fL   MCH 18.6 (L) 26.0 - 34.0 pg   MCHC 26.5 (L) 30.0 - 36.0 g/dL   RDW 71.0 (H) 62.6 - 94.8 %   Platelets 360 150 - 400 K/uL   nRBC 0.0 0.0 - 0.2 %    Comment: Performed at [facility], [address],  [place], [place] [address]  Lactic acid, plasma     Status: Abnormal   Collection Time: 02/25/23  1:29 AM  Result Value Ref Range   Lactic Acid, Venous 2.6 (HH) 0.5 - 1.9 mmol/L    Comment: CRITICAL RESULT CALLED TO, READ BACK BY AND VERIFIED WITH Dub Amis [id number] Nipper Performed at [facility], [address], [place], [place] [address]   ABO/Rh     Status: None   Collection Time: 02/25/23  8:50 AM  Result Value Ref Range   ABO/RH(D)      O POS Performed at [facility], [address] [place], [place] [address]   Glucose, capillary     Status: Abnormal   Collection Time: 02/25/23 12:41 PM  Result Value Ref Range   Glucose-Capillary 147 (H) 70 - 99 mg/dL    Comment: Glucose reference range applies only to samples taken after fasting for at least 8 hours.    US ARTERIAL ABI (SCREENING LOWER EXTREMITY)  Result Date: 02/25/2023 CLINICAL DATA:  Right second toe redness, swelling, warmth, tenderness, drainage with odor Hypertension Diabetes Hyperlipidemia EXAM: NONINVASIVE PHYSIOLOGIC  VASCULAR STUDY OF BILATERAL LOWER EXTREMITIES TECHNIQUE: Evaluation of both lower extremities were performed at rest, including calculation of ankle-brachial indices with single level pressure measurements and doppler recording. COMPARISON:  None available. FINDINGS: Right ABI:  1.46 Left ABI:  1.41 Right Lower Extremity:  Normal arterial waveforms at the ankle. Left Lower Extremity:  Normal arterial waveforms at the ankle. > 1.4 Non diagnostic secondary to incompressible vessel calcifications (medial arterial sclerosis of [place]) IMPRESSION: Nondiagnostic examination of the lower extremity arteries due to incompressible vessels. If there is continued concern for arterial stenosis, further evaluation with CT angiography would be beneficial. Electronically Signed   By: Acquanetta Belling M.D.   On: 02/25/2023 10:08   CT Angio Chest PE W and/or Wo Contrast  Result Date: 02/24/2023 CLINICAL DATA:  Concern for  pulmonary edema. EXAM: CT ANGIOGRAPHY CHEST WITH CONTRAST TECHNIQUE: Multidetector CT imaging of the chest was performed using the standard protocol during bolus administration of intravenous contrast. Multiplanar CT image reconstructions and MIPs were obtained to evaluate the vascular anatomy. RADIATION DOSE REDUCTION: This exam was performed according to the departmental dose-optimization program which includes automated exposure control, adjustment of the mA and/or kV according to patient size and/or use of iterative reconstruction technique. CONTRAST:  75mL OMNIPAQUE IOHEXOL 350 MG/ML SOLN COMPARISON:  Chest radiograph dated 02/24/2023. FINDINGS: Cardiovascular: There is no cardiomegaly or pericardial effusion. There is 3 vessel coronary vascular calcification. The thoracic aorta is unremarkable. The origins of the great vessels of the aortic arch appear patent. Evaluation of the pulmonary arteries is limited due to respiratory motion and suboptimal opacification and timing of the contrast. No obvious large or central pulmonary artery embolus identified. Mediastinum/Nodes: No hilar or mediastinal adenopathy. The esophagus is grossly unremarkable. No mediastinal fluid collection. Lungs/Pleura: The lungs are clear. There is no pleural effusion pneumothorax. The central airways are patent. Upper Abdomen: No acute abnormality. Musculoskeletal: No acute osseous pathology. Review of the MIP images confirms the above findings. IMPRESSION: 1. No acute intrathoracic pathology. No central pulmonary artery embolus. 2. Coronary vascular calcification. Electronically Signed   By: Elgie Collard M.D.   On: 02/24/2023 19:59   DG Toe 2nd Right  Result Date: 02/24/2023 CLINICAL DATA:  Redness and swelling of the toe EXAM: RIGHT SECOND TOE COMPARISON:  Right second toe radiograph dated 01/31/2020 FINDINGS: Soft tissue edema about the second toe with irregular at the distal aspect. Irregular cortical lucency underlying the  soft tissue wound involving the second toe distal phalanx. There is no evidence of fracture or dislocation. IMPRESSION: Distal second toe soft tissue wound with underlying irregular cortical lucency of the second toe distal phalanx, suspicious for osteomyelitis. Electronically Signed   By: Agustin Cree M.D.   On: 02/24/2023 16:52   DG Chest 2 View  Result Date: 02/24/2023 CLINICAL DATA:  Shortness of breath. EXAM: CHEST - 2 VIEW COMPARISON:  01/07/2023. FINDINGS: Bilateral lung fields are clear. Bilateral costophrenic angles are clear. Normal cardio-mediastinal silhouette. No acute osseous abnormalities. The soft tissues are within normal limits. IMPRESSION: No active cardiopulmonary disease. Electronically Signed   By: Jules Schick M.D.   On: 02/24/2023 14:13     Assessment & Plan:  MIKEY CONGROVE is a 57 y.o. male with palpable pulses bilaterally. ABI is non-0diagnostic due to hardening of vessels but palpable pulse reassuring.  MRI ordered to verify that this is osteomyelitis Discussed that if this is we would recommend amputation and could do that Monday Follow up MRI EGD  today per report  from RN   All questions were answered to the satisfaction of the patient and family.   [name] 02/25/2023, 1:41 PM

## 2023-02-26 DIAGNOSIS — D649 Anemia, unspecified: Secondary | ICD-10-CM | POA: Diagnosis not present

## 2023-02-26 DIAGNOSIS — M869 Osteomyelitis, unspecified: Secondary | ICD-10-CM | POA: Diagnosis not present

## 2023-02-26 DIAGNOSIS — D509 Iron deficiency anemia, unspecified: Secondary | ICD-10-CM | POA: Diagnosis not present

## 2023-02-26 DIAGNOSIS — K921 Melena: Secondary | ICD-10-CM | POA: Diagnosis not present

## 2023-02-26 DIAGNOSIS — K3189 Other diseases of stomach and duodenum: Secondary | ICD-10-CM | POA: Diagnosis not present

## 2023-02-26 LAB — CBC
HCT: 28.7 % — ABNORMAL LOW (ref 39.0–52.0)
Hemoglobin: 7.6 g/dL — ABNORMAL LOW (ref 13.0–17.0)
MCH: 19.3 pg — ABNORMAL LOW (ref 26.0–34.0)
MCHC: 26.5 g/dL — ABNORMAL LOW (ref 30.0–36.0)
MCV: 72.8 fL — ABNORMAL LOW (ref 80.0–100.0)
Platelets: 370 10*3/uL (ref 150–400)
RBC: 3.94 MIL/uL — ABNORMAL LOW (ref 4.22–5.81)
RDW: 18.8 % — ABNORMAL HIGH (ref 11.5–15.5)
WBC: 7.1 10*3/uL (ref 4.0–10.5)
nRBC: 0 % (ref 0.0–0.2)

## 2023-02-26 LAB — IRON AND TIBC
Iron: 14 ug/dL — ABNORMAL LOW (ref 45–182)
Saturation Ratios: 4 % — ABNORMAL LOW (ref 17.9–39.5)
TIBC: 355 ug/dL (ref 250–450)
UIBC: 341 ug/dL

## 2023-02-26 LAB — BPAM RBC
Blood Product Expiration Date: 202412262359
ISSUE DATE / TIME: 202412061251
Unit Type and Rh: 5100

## 2023-02-26 LAB — LACTIC ACID, PLASMA: Lactic Acid, Venous: 1.3 mmol/L (ref 0.5–1.9)

## 2023-02-26 LAB — TYPE AND SCREEN
ABO/RH(D): O POS
Antibody Screen: NEGATIVE
Unit division: 0

## 2023-02-26 LAB — GLUCOSE, CAPILLARY
Glucose-Capillary: 228 mg/dL — ABNORMAL HIGH (ref 70–99)
Glucose-Capillary: 224 mg/dL — ABNORMAL HIGH (ref 70–99)
Glucose-Capillary: 229 mg/dL — ABNORMAL HIGH (ref 70–99)
Glucose-Capillary: 313 mg/dL — ABNORMAL HIGH (ref 70–99)
Glucose-Capillary: 271 mg/dL — ABNORMAL HIGH (ref 70–99)

## 2023-02-26 LAB — RETICULOCYTES
Immature Retic Fract: 39 % — ABNORMAL HIGH (ref 2.3–15.9)
RBC.: 3.89 MIL/uL — ABNORMAL LOW (ref 4.22–5.81)
Retic Count, Absolute: 57.6 10*3/uL (ref 19.0–186.0)
Retic Ct Pct: 1.5 % (ref 0.4–3.1)

## 2023-02-26 LAB — FERRITIN: Ferritin: 5 ng/mL — ABNORMAL LOW (ref 24–336)

## 2023-02-26 LAB — VITAMIN B12: Vitamin B-12: 196 pg/mL (ref 180–914)

## 2023-02-26 LAB — FOLATE: Folate: 26.4 ng/mL (ref >5.9)

## 2023-02-26 MED ORDER — VITAMIN B-12 1000 MCG PO TABS
1000.0000 ug | ORAL_TABLET | Freq: Every day | ORAL | Status: DC
  Administered 2023-02-26 – 2023-03-01 (×3): 1000 ug via ORAL
  Filled 2023-02-26 (×4): qty 1

## 2023-02-26 MED ORDER — POTASSIUM CHLORIDE CRYS ER 20 MEQ PO TBCR
40.0000 meq | EXTENDED_RELEASE_TABLET | Freq: Once | ORAL | Status: AC
  Administered 2023-02-26: 40 meq via ORAL
  Filled 2023-02-26: qty 2

## 2023-02-26 MED ORDER — VANCOMYCIN HCL 1250 MG/250ML IV SOLN
1250.0000 mg | Freq: Two times a day (BID) | INTRAVENOUS | Status: DC
  Administered 2023-02-26 – 2023-03-01 (×6): 1250 mg via INTRAVENOUS
  Filled 2023-02-26 (×6): qty 250

## 2023-02-26 MED ORDER — TRAZODONE HCL 50 MG PO TABS
50.0000 mg | ORAL_TABLET | Freq: Every evening | ORAL | Status: DC | PRN
Start: 2023-02-26 — End: 2023-02-27
  Administered 2023-02-26: 50 mg via ORAL
  Filled 2023-02-26: qty 1

## 2023-02-26 MED ORDER — POLYSACCHARIDE IRON COMPLEX 150 MG PO CAPS
150.0000 mg | ORAL_CAPSULE | Freq: Every day | ORAL | Status: DC
Start: 2023-02-26 — End: 2023-03-01
  Administered 2023-02-26 – 2023-03-01 (×3): 150 mg via ORAL
  Filled 2023-02-26 (×4): qty 1

## 2023-02-26 NOTE — Progress Notes (Signed)
Patient without complaints this morning.  Tolerating regular breakfast.  No abdominal pain.  No melena.  Hemoglobin 7.1-7.8 past 24 hours.  Seen with Dr. Henreitta Leber.  Reviewed EGD findings  Vital signs in last 24 hours: Temp:  [97.7 F (36.5 C)-99.5 F (37.5 C)] 98.5 F (36.9 C) (12/06 2059) Pulse Rate:  [73-95] 95 (12/06 2059) Resp:  [18-28] 20 (12/06 2059) BP: (110-140)/(59-84) 128/76 (12/06 2059) SpO2:  [93 %-100 %] 99 % (12/06 2059) Weight:  [118.9 kg-119.4 kg] 118.9 kg (12/07 0500) Last BM Date : 02/25/23 General:   Alert,  Well-developed, well-nourished, pleasant and cooperative in NAD Abdomen: Nondistended positive bowel sounds soft and nontender  extremities:  Without clubbing or edema.    Intake/Output from previous day: 12/06 0701 - 12/07 0700 In: 1040.7 [I.V.:335.4; Blood:338.5; IV Piggyback:366.8] Out: -  Intake/Output this shift: No intake/output data recorded.  Lab Results: Recent Labs    02/24/23 1301 02/25/23 0129 02/26/23 0425  WBC 8.9 7.3 7.1  HGB 7.8* 7.1* 7.6*  HCT 27.5* 26.8* 28.7*  PLT 466* 360 370   BMET Recent Labs    02/24/23 1301 02/25/23 0129 02/25/23 1955  NA 134* 137 137  K 3.9 3.7 3.4*  CL 101 106 108  CO2 20* 20* 20*  GLUCOSE 432* 326* 200*  BUN 19 17 13   CREATININE 1.00 0.84 0.73  CALCIUM 8.9 8.5* 8.3*   LFT Recent Labs    02/24/23 1301  PROT 7.4  ALBUMIN 3.6  AST 13*  ALT 13  ALKPHOS 74  BILITOT 0.2   PT/INR Recent Labs    02/24/23 2317  LABPROT 13.5  INR 1.0   Hepatitis Panel No results for input(s): "HEPBSAG", "HCVAB", "HEPAIGM", "HEPBIGM" in the last 72 hours. C-Diff No results for input(s): "CDIFFTOX" in the last 72 hours.   Impression:  57 year old gentleman regular NSAID use admitted to the hospital with progressive anemia, fatigue, melena.  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  Biopsies for H. pylori pending.  Patient has been quite stable overnight.  Osteomyelitis of the right  second toe confirmed by MRI.  At least partial amputation planned for 12/9.  Recommendations:  Absolute NSAID avoidance moving forward  Continue pantoprazole 40 mg twice daily  Will circle back around with him and plan for colonoscopy (first ever) in 4 to 6 weeks.  Follow-up on HP biopsies.  Will continue to follow peripherally.

## 2023-02-26 NOTE — Progress Notes (Addendum)
Progress Note   Patient: Kevin Strong [id number] DOB: Dec 03, 1965 DOA: 02/24/2023     2 DOS: the patient was seen and examined on 02/26/2023   Brief hospital admission course: Kevin Strong is a 57 y.o. male with medical history significant for type II diabetes mellitus, hypertension and dyslipidemia, presented to the emergency room with acute onset of worsening dyspnea which has been going on over the last month.  She has been having dyspnea on exertion for the last month that got significantly worse over the last several days.  He cannot go 6-8 steps without having worsening dyspnea.  He admitted to chest pain only with cough.  No fever or chills.    He was seen in [place] ER in October for similar symptoms and found to be anemic with with he was transfused and later transferred to [facility] where he was supposed to have an EGD but he was dissatisfied with the service and left AMA.  He has been having right second toe swelling since September.  He was seen for that in the ED here and was given p.o. doxycycline for toe infection.  This unfortunately has been getting worse with worsening swelling and mild tenderness with erythema.  This has been associated with foul smell drainage.  He denied any fever or chills.  No nausea or vomiting or abdominal pain.  No cough or wheezing or hemoptysis.  No adequate dizziness or blurred vision.  No dysuria, oliguria or hematuria or flank pain.   ED Course: Upon presentation to the emergency room, BP was 148/70 with heart rate 106 and respiratory to 22 and otherwise normal vital signs.  Labs revealed mild hyponatremia 134 and CO2 of 20 with blood glucose of 432 and high sensitive troponin I of 2 twice and BNP 27.  Lactic acid was 3.1 and CBC showed anemia with hemoglobin 7.8 hematocrit 27.5 compared to 10.8 and 34 on 11/26/2022 with microcytosis and thrombocytosis.  Blood group was O+ with negative antibody screen.  Stool Hemoccult came  back negative. EKG as reviewed by me : EKG showed normal sinus rhythm with a rate of 100. Imaging: Right second toe x-ray showed distal second toe soft tissue wound with underlying irregular cortical lucency of the second toe distal phalanx suspicious for osteomyelitis.  Assessment and Plan: * Symptomatic anemia, Iron deficiency.  - No overt bleeding appreciated -Fecal occult blood test was negative -Received one unit PRBC 12/06 -Continue to follow hemoglobin trend -GI service consulted. Underwent endoscopy 12/06: Small hiatal hernia, noncritical Schatzki ring, numerous superficially erosion second portion of the duodenal.  -Avoid NSAIDs.  Follow biopsy, needs Protonix twice daily. -Check anemia panel, iron at 14, B12 196.  Started iron and B12 supplement   Osteomyelitis of second toe of right foot (HCC) - Continue current IV antibiotics (Zosyn and vancomycin). -Case discussed with orthopedic surgery who recommended general surgery involvement for most likely amputation. -ABI: P and MRI: Active osteomyelitis of the distal phalanx second toe with bony destructive findings of the tuft, and abnormal edema signal and enhancement in the marrow. Overlying soft tissue defect favoring ulceration along the plantar distal margin of the toe extending to the bony margin. -Continue as needed analgesics and supportive care. -Plan for Sx on Monday   Lactic acidosis/sepsis due to cellulitis (HCC) - Continue to maintain adequate hydration -Continue current IV antibiotic -Follow lactic acid level -Follow clinical response.  Type 2 diabetes mellitus with hyperglycemia, with long-term current use of insulin (HCC) -  Continue sliding scale insulin and Semglee -Follow CBG fluctuation and further adjust hypoglycemic regimen -Update A1c.  Dyslipidemia -Continue statin.  Essential hypertension -Stable and well-controlled -Continue current antihypertensive regimen  Hypokalemia; replete orally.   GERD  without esophagitis -Continue PPI.  Class II obesity -Body mass index is 35.56 kg/m.  -Portion control, low calorie diet and increase physical activity discussed with patient.   Subjective:  He is feeling better, he is feeling with more energy.  Prior to admission he was very short of breath.  He was able to speak with the surgeon about amputation  Physical Exam: Vitals:   02/25/23 1845 02/25/23 1907 02/25/23 2059 02/26/23 0500  BP: 130/69 (!) 140/84 128/76   Pulse: 80 73 95   Resp: 20 20 20    Temp:  98.6 F (37 C) 98.5 F (36.9 C)   TempSrc:  Oral Oral   SpO2: 99% 100% 99%   Weight:    118.9 kg  Height:       General exam: NAD Respiratory system: CTA Cardiovascular system: S 1, S 2 RRR Gastrointestinal system: BS present, soft, nt Central nervous system: Alert Extremities: Right second toe with swelling and appreciated cellulitic changes.  Data Reviewed:   Family Communication: Wife at bedside.  Disposition: Status is: Inpatient Remains inpatient appropriate because: Continue IV antibiotics and further workup for iron deficiency anemia.   Planned Discharge Destination: Home  Time spent: 50 minutes  Author: Alba Cory, MD 02/26/2023 10:21 AM  For on call review [email or web address].

## 2023-02-26 NOTE — Progress Notes (Signed)
[facility]  Doing fair. Had NSAID enteropathy/gastropathy on EGD. MRI with osteo distal phalanx.  BP 128/76   Pulse 95   Temp 98.5 F (36.9 C) (Oral)   Resp 20   Ht 6' (1.829 m)   Wt 118.9 kg   SpO2 99%   BMI 35.56 kg/m  Toe less swollen 2+ DP and PT  Plan for right 2nd toe amputation Monday. Can hopefully do a partial amputation and save the proximal phalanx at least to help with balance, and prevent shifting of the toe box preventing future issues.  Antibiotics to continue for now  Algis Greenhouse, MD [facility] [address] [place], [place] [address] [phone] (office)

## 2023-02-26 NOTE — Progress Notes (Signed)
Pharmacy Antibiotic Note  Kevin Strong is a 57 y.o. male admitted on 02/24/2023 with cellulitis  Pharmacy has been consulted for Vancomycin dosing. Plan for right 2nd toe amputation 12/9 Monday . Osteomyelitis of the right second toe confirmed by MRI . CrCL improved, dose adjusted.  Plan: Vancomycin 1250 mg IV Q 12 hrs. Goal AUC 400-550. Expected AUC: 438 SCr used: 0.8(actual 0.73)  Zosyn 3.375g IV q8h (4 hour infusion). F/U cxs and clinical progress Monitor V/S, labs and levels as indicated  Height: 6' (182.9 cm) Weight: 118.9 kg (262 lb 3.2 oz) IBW/kg (Calculated) : 77.6  Temp (24hrs), Avg:98.5 F (36.9 C), Min:97.7 F (36.5 C), Max:99.5 F (37.5 C)  Recent Labs  Lab 02/24/23 1301 02/24/23 2317 02/25/23 0129 02/25/23 1955 02/26/23 0425  WBC 8.9  --  7.3  --  7.1  CREATININE 1.00  --  0.84 0.73  --   LATICACIDVEN 3.1* 2.1* 2.6*  --  1.3    Estimated Creatinine Clearance: 134 mL/min (by C-G formula based on SCr of 0.73 mg/dL).    No Known Allergies  Antimicrobials this admission: Vancomycin 12/5>> Zosyn 12/6>> Ceftriaxone/flagyl 12/5 x 1 dose  Microbiology results: 12/5 BCx: ngtd   Thank you for allowing pharmacy to be a part of this patient's care.  Elder Cyphers, BS Pharm D, BCPS Clinical Pharmacist 02/26/2023 10:44 AM

## 2023-02-26 NOTE — Plan of Care (Signed)
  Problem: Education: Goal: Knowledge of General Education information will improve Description: Including pain rating scale, medication(s)/side effects and non-pharmacologic comfort measures Outcome: Progressing   Problem: Health Behavior/Discharge Planning: Goal: Ability to manage health-related needs will improve Outcome: Progressing   Problem: Clinical Measurements: Goal: Ability to maintain clinical measurements within normal limits will improve Outcome: Progressing Goal: Will remain free from infection Outcome: Progressing Goal: Diagnostic test results will improve Outcome: Progressing Goal: Respiratory complications will improve Outcome: Progressing Goal: Cardiovascular complication will be avoided Outcome: Progressing   Problem: Activity: Goal: Risk for activity intolerance will decrease Outcome: Progressing   Problem: Nutrition: Goal: Adequate nutrition will be maintained Outcome: Progressing   Problem: Coping: Goal: Level of anxiety will decrease Outcome: Progressing   Problem: Elimination: Goal: Will not experience complications related to bowel motility Outcome: Progressing Goal: Will not experience complications related to urinary retention Outcome: Progressing   Problem: Pain Management: Goal: General experience of comfort will improve Outcome: Progressing   Problem: Safety: Goal: Ability to remain free from injury will improve Outcome: Progressing   Problem: Skin Integrity: Goal: Risk for impaired skin integrity will decrease Outcome: Progressing   Problem: Fluid Volume: Goal: Hemodynamic stability will improve Outcome: Progressing   Problem: Clinical Measurements: Goal: Diagnostic test results will improve Outcome: Progressing Goal: Signs and symptoms of infection will decrease Outcome: Progressing   Problem: Respiratory: Goal: Ability to maintain adequate ventilation will improve Outcome: Progressing   Problem: Education: Goal:  Ability to describe self-care measures that may prevent or decrease complications (Diabetes Survival Skills Education) will improve Outcome: Progressing Goal: Individualized Educational Video(s) Outcome: Progressing   Problem: Coping: Goal: Ability to adjust to condition or change in health will improve Outcome: Progressing   Problem: Fluid Volume: Goal: Ability to maintain a balanced intake and output will improve Outcome: Progressing   Problem: Health Behavior/Discharge Planning: Goal: Ability to identify and utilize available resources and services will improve Outcome: Progressing Goal: Ability to manage health-related needs will improve Outcome: Progressing   Problem: Metabolic: Goal: Ability to maintain appropriate glucose levels will improve Outcome: Progressing   Problem: Nutritional: Goal: Maintenance of adequate nutrition will improve Outcome: Progressing Goal: Progress toward achieving an optimal weight will improve Outcome: Progressing   Problem: Skin Integrity: Goal: Risk for impaired skin integrity will decrease Outcome: Progressing   Problem: Tissue Perfusion: Goal: Adequacy of tissue perfusion will improve Outcome: Progressing

## 2023-02-27 DIAGNOSIS — L0390 Cellulitis, unspecified: Secondary | ICD-10-CM | POA: Diagnosis not present

## 2023-02-27 DIAGNOSIS — M869 Osteomyelitis, unspecified: Secondary | ICD-10-CM | POA: Diagnosis not present

## 2023-02-27 DIAGNOSIS — D649 Anemia, unspecified: Secondary | ICD-10-CM | POA: Diagnosis not present

## 2023-02-27 DIAGNOSIS — K219 Gastro-esophageal reflux disease without esophagitis: Secondary | ICD-10-CM | POA: Diagnosis not present

## 2023-02-27 LAB — BASIC METABOLIC PANEL
Anion gap: 7 (ref 5–15)
BUN: 15 mg/dL (ref 6–20)
CO2: 20 mmol/L — ABNORMAL LOW (ref 22–32)
Calcium: 8.1 mg/dL — ABNORMAL LOW (ref 8.9–10.3)
Chloride: 107 mmol/L (ref 98–111)
Creatinine, Ser: 0.84 mg/dL (ref 0.61–1.24)
GFR, Estimated: >60 mL/min (ref >60)
Glucose, Bld: 256 mg/dL — ABNORMAL HIGH (ref 70–99)
Potassium: 3.6 mmol/L (ref 3.5–5.1)
Sodium: 134 mmol/L — ABNORMAL LOW (ref 135–145)

## 2023-02-27 LAB — CBC
HCT: 27.7 % — ABNORMAL LOW (ref 39.0–52.0)
Hemoglobin: 7.4 g/dL — ABNORMAL LOW (ref 13.0–17.0)
MCH: 19.4 pg — ABNORMAL LOW (ref 26.0–34.0)
MCHC: 26.7 g/dL — ABNORMAL LOW (ref 30.0–36.0)
MCV: 72.5 fL — ABNORMAL LOW (ref 80.0–100.0)
Platelets: 332 10*3/uL (ref 150–400)
RBC: 3.82 MIL/uL — ABNORMAL LOW (ref 4.22–5.81)
RDW: 19.1 % — ABNORMAL HIGH (ref 11.5–15.5)
WBC: 6.1 10*3/uL (ref 4.0–10.5)
nRBC: 0 % (ref 0.0–0.2)

## 2023-02-27 LAB — SURGICAL PCR SCREEN
MRSA, PCR: NEGATIVE
Staphylococcus aureus: NEGATIVE

## 2023-02-27 LAB — GLUCOSE, CAPILLARY
Glucose-Capillary: 270 mg/dL — ABNORMAL HIGH (ref 70–99)
Glucose-Capillary: 202 mg/dL — ABNORMAL HIGH (ref 70–99)
Glucose-Capillary: 249 mg/dL — ABNORMAL HIGH (ref 70–99)
Glucose-Capillary: 255 mg/dL — ABNORMAL HIGH (ref 70–99)

## 2023-02-27 MED ORDER — TEMAZEPAM 15 MG PO CAPS
15.0000 mg | ORAL_CAPSULE | Freq: Every evening | ORAL | Status: DC | PRN
  Administered 2023-02-27 – 2023-02-28 (×2): 15 mg via ORAL
  Filled 2023-02-27 (×2): qty 1

## 2023-02-27 MED ORDER — CEFAZOLIN SODIUM-DEXTROSE 2-4 GM/100ML-% IV SOLN
2.0000 g | INTRAVENOUS | Status: DC
  Filled 2023-02-27: qty 100

## 2023-02-27 MED ORDER — MUPIROCIN 2 % EX OINT: 1.0000 | TOPICAL_OINTMENT | Freq: Two times a day (BID) | CUTANEOUS | Status: DC

## 2023-02-27 MED ORDER — CHLORHEXIDINE GLUCONATE CLOTH 2 % EX PADS
6.0000 | MEDICATED_PAD | Freq: Once | CUTANEOUS | Status: AC
Start: 2023-02-27 — End: 2023-02-27
  Administered 2023-02-27: 6 via TOPICAL

## 2023-02-27 MED ORDER — CHLORHEXIDINE GLUCONATE CLOTH 2 % EX PADS
6.0000 | MEDICATED_PAD | Freq: Once | CUTANEOUS | Status: AC
  Administered 2023-02-28: 6 via TOPICAL

## 2023-02-27 NOTE — Progress Notes (Signed)
Progress Note   Patient: Kevin Strong [id number]:[id number] DOB: 20-Feb-1966 DOA: 02/24/2023     3 DOS: the patient was seen and examined on 02/27/2023   Brief hospital admission course: [name] is a 57 y.o. male with medical history significant for type II diabetes mellitus, hypertension and dyslipidemia, presented to the emergency room with acute onset of worsening dyspnea which has been going on over the last month.  She has been having dyspnea on exertion for the last month that got significantly worse over the last several days.  He cannot go 6-8 steps without having worsening dyspnea.  He admitted to chest pain only with cough.  No fever or chills.    He was seen in [place] ER in October for similar symptoms and found to be anemic with with he was transfused and later transferred to [facility] where he was supposed to have an EGD but he was dissatisfied with the service and left AMA.  He has been having right second toe swelling since September.  He was seen for that in the ED here and was given p.o. doxycycline for toe infection.  This unfortunately has been getting worse with worsening swelling and mild tenderness with erythema.  This has been associated with foul smell drainage.  He denied any fever or chills.  No nausea or vomiting or abdominal pain.  No cough or wheezing or hemoptysis.  No adequate dizziness or blurred vision.  No dysuria, oliguria or hematuria or flank pain.   ED Course: Upon presentation to the emergency room, BP was 148/70 with heart rate 106 and respiratory to 22 and otherwise normal vital signs.  Labs revealed mild hyponatremia 134 and CO2 of 20 with blood glucose of 432 and high sensitive troponin I of 2 twice and BNP 27.  Lactic acid was 3.1 and CBC showed anemia with hemoglobin 7.8 hematocrit 27.5 compared to 10.8 and 34 on 11/26/2022 with microcytosis and thrombocytosis.  Blood group was O+ with negative antibody screen.  Stool Hemoccult came  back negative. EKG as reviewed by me : EKG showed normal sinus rhythm with a rate of 100. Imaging: Right second toe x-ray showed distal second toe soft tissue wound with underlying irregular cortical lucency of the second toe distal phalanx suspicious for osteomyelitis.  Assessment and Plan: * Symptomatic anemia, Iron deficiency.  - No overt bleeding appreciated -Fecal occult blood test was negative -Received one unit PRBC 12/06 -Continue to follow hemoglobin trend -GI service consulted. Underwent endoscopy 12/06: Small hiatal hernia, noncritical Schatzki ring, numerous superficially erosion second portion of the duodenal.  -Avoid NSAIDs.  Follow biopsy, needs Protonix twice daily. -Check anemia panel, iron at 14, B12 196.  Started iron and B12 supplementation. -Continue PPI.  Osteomyelitis of second toe of right foot (HCC) - Continue current IV antibiotics (Zosyn and vancomycin). -Case discussed with orthopedic surgery who recommended general surgery involvement for most likely amputation. -ABI: P and MRI: Active osteomyelitis of the distal phalanx second toe with bony destructive findings of the tuft, and abnormal edema signal and enhancement in the marrow. Overlying soft tissue defect favoring ulceration along the plantar distal margin of the toe extending to the bony margin. -Continue as needed analgesics and supportive care. -Plan for Sx on Monday (general surgery recommendations.  Lactic acidosis/sepsis due to cellulitis (HCC) -Continue to maintain adequate hydration -Continue current IV antibiotic -Sepsis features resolved -Right second toe amputation on 02/28/2023.  Type 2 diabetes mellitus with hyperglycemia, with long-term current  use of insulin (HCC) - Continue sliding scale insulin and Semglee -Follow CBG fluctuation and further adjust hypoglycemic regimen -Update A1c.  Dyslipidemia -Continue statin. -Heart healthy diet discussed with patient.  Essential  hypertension -Stable and well-controlled -Continue current antihypertensive regimen -  Hypokalemia -Repleted -Continue to follow electrolytes trend.  GERD without esophagitis -Continue PPI.  Class II obesity -Body mass index is 35.56 kg/m.  -Portion control, low calorie diet and increase physical activity discussed with patient.   Subjective:  No overt bleeding reported.  Overall feeling much better and expressing no chest pain, no nausea, no vomiting, no fever and no shortness of breath.  Physical Exam: Vitals:   02/26/23 1437 02/26/23 2047 02/27/23 0438 02/27/23 1357  BP: 135/71 139/88 114/72 138/80  Pulse: 90 81 71 77  Resp: 20 18 18 20   Temp: 98.5 F (36.9 C) 98.2 F (36.8 C) 98.2 F (36.8 C) 98.1 F (36.7 C)  TempSrc: Oral Oral Oral Oral  SpO2: 99% 99% 96% 97%  Weight:      Height:       General exam: Alert, awake, oriented x 3; in no major distress.  Expressing no overt bleeding.  Feeling improved. Respiratory system: Clear to auscultation. Respiratory effort normal.  Good saturation on room air. Cardiovascular system:RRR. No rub or gallops. Gastrointestinal system: Abdomen is obesity, nondistended, soft and nontender. No organomegaly or masses felt. Normal bowel sounds heard. Central nervous system: Alert and oriented. No focal neurological deficits. Extremities: No cyanosis or clubbing; right second toe with swelling, improved cellulitic changes and with the green tip. Skin: No petechiae. Psychiatry: Judgement and insight appear normal. Mood & affect appropriate.    Data Reviewed:   Family Communication: Wife at bedside.  Disposition: Status is: Inpatient Remains inpatient appropriate because: Continue IV antibiotics and further workup for iron deficiency anemia; and anticipated second toe amputation on 02/28/2023.   Planned Discharge Destination: Home  Time spent: 50 minutes  Author: Vassie Loll, MD 02/27/2023 5:55 PM  For on call review  [email or web address].

## 2023-02-27 NOTE — Progress Notes (Signed)
[facility] Progress Note  2 Days Post-Op  Subjective: Doing fair.   Objective: Vital signs in last 24 hours: Temp:  [98.2 F (36.8 C)-98.5 F (36.9 C)] 98.2 F (36.8 C) (12/08 0438) Pulse Rate:  [71-90] 71 (12/08 0438) Resp:  [18-20] 18 (12/08 0438) BP: (114-139)/(71-88) 114/72 (12/08 0438) SpO2:  [96 %-99 %] 96 % (12/08 0438) Last BM Date : 02/26/23  Intake/Output from previous day: 12/07 0701 - 12/08 0700 In: 430.6 [P.O.:240; IV Piggyback:190.6] Out: -  Intake/Output this shift: No intake/output data recorded.  General appearance: alert and no distress Extremities: right 2nd toe less swollen, still with erythema, dry ulceration at tip   Lab Results:  Recent Labs    02/26/23 0425 02/27/23 0346  WBC 7.1 6.1  HGB 7.6* 7.4*  HCT 28.7* 27.7*  PLT 370 332   BMET Recent Labs    02/25/23 1955 02/27/23 0346  NA 137 134*  K 3.4* 3.6  CL 108 107  CO2 20* 20*  GLUCOSE 200* 256*  BUN 13 15  CREATININE 0.73 0.84  CALCIUM 8.3* 8.1*   PT/INR Recent Labs    02/24/23 2317  LABPROT 13.5  INR 1.0    Studies/Results: MR FOOT RIGHT W WO CONTRAST  Result Date: 02/25/2023 CLINICAL DATA:  Osteomyelitis. Diabetes. Erythema and swelling of the second EXAM: MRI OF THE RIGHT FOREFOOT WITHOUT AND WITH CONTRAST TECHNIQUE: Multiplanar, multisequence MR imaging of the right forefoot was performed before and after the administration of intravenous contrast. CONTRAST:  10mL GADAVIST GADOBUTROL 1 MMOL/ML IV SOLN COMPARISON:  02/24/2023 radiographs FINDINGS: Bones/Joint/Cartilage Active osteomyelitis distal phalanx second toe with bony destructive findings of the tuft, and abnormal edema signal and enhancement in the marrow. Overlying soft tissue defect favoring ulceration along the plantar distal margin of the toe extending to the bony margin on image 12 series 12. Trace edema signal medially in the medial sesamoid of the first digit is likely degenerative. Small  degenerative subcortical cystic lesion along the lateral head of the proximal phalanx great toe, image 8 series 5. Ligaments No findings of turf toe or plantar plate injury. Muscles and Tendons Low-level edema tracks along otherwise atrophic regional musculature, likely neuropathic. Soft tissues Dorsal subcutaneous edema in the forefoot favoring cellulitis. Subcutaneous edema and soft tissue swelling in the second toe compatible with cellulitis. Plantar ulceration along the distal second toe extending to the bony margin of the distal phalanx. IMPRESSION: 1. Active osteomyelitis of the distal phalanx second toe with bony destructive findings of the tuft, and abnormal edema signal and enhancement in the marrow. Overlying soft tissue defect favoring ulceration along the plantar distal margin of the toe extending to the bony margin. 2. Dorsal subcutaneous edema in the forefoot favoring cellulitis. 3. Low-level edema tracks along otherwise atrophic regional musculature, likely neuropathic. Electronically Signed   By: Gaylyn Rong M.D.   On: 02/25/2023 17:24    Anti-infectives: Anti-infectives (From admission, onward)    Start     Dose/Rate Route Frequency Ordered Stop   02/27/23 1145  ceFAZolin (ANCEF) IVPB 2g/100 mL premix        2 g 200 mL/hr over 30 Minutes Intravenous On call to O.R. 02/27/23 1056 02/28/23 0559   02/26/23 1800  vancomycin (VANCOREADY) IVPB 1250 mg/250 mL        1,250 mg 166.7 mL/hr over 90 Minutes Intravenous Every 12 hours 02/26/23 1043     02/25/23 0000  piperacillin-tazobactam (ZOSYN) IVPB 3.375 g  3.375 g 12.5 mL/hr over 240 Minutes Intravenous Every 8 hours 02/24/23 2300     02/25/23 0000  vancomycin (VANCOREADY) IVPB 750 mg/150 mL  Status:  Discontinued        750 mg 150 mL/hr over 60 Minutes Intravenous Every 8 hours 02/24/23 2307 02/26/23 1043   02/24/23 2315  vancomycin (VANCOCIN) IVPB 1000 mg/200 mL premix  Status:  Discontinued        1,000 mg 200 mL/hr over  60 Minutes Intravenous  Once 02/24/23 2300 02/24/23 2303   02/24/23 2200  metroNIDAZOLE (FLAGYL) tablet 500 mg  Status:  Discontinued       Placed in "And" Linked Group   500 mg Oral Every 12 hours 02/24/23 1734 02/24/23 2300   02/24/23 1745  cefTRIAXone (ROCEPHIN) 2 g in sodium chloride 0.9 % 100 mL IVPB  Status:  Discontinued       Placed in "And" Linked Group   2 g 200 mL/hr over 30 Minutes Intravenous Every 24 hours 02/24/23 1734 02/24/23 2300       Assessment/Plan: Patient with 2nd toe osteomyelitis on the right of the distal phalanx. Discussed amputation and risk of bleeding, infection, taking part or all of the toe, and risk of future issues with feet due to diabetes. Discussed he will likely need to elevate his feet and be off them for 2-3 weeks post op. He is going to talk to his boss.   OR tomorrow NPO midnight    LOS: 3 days    Kevin Strong 02/27/2023

## 2023-02-27 NOTE — Plan of Care (Signed)
  Problem: Education: Goal: Knowledge of General Education information will improve Description: Including pain rating scale, medication(s)/side effects and non-pharmacologic comfort measures Outcome: Progressing   Problem: Health Behavior/Discharge Planning: Goal: Ability to manage health-related needs will improve Outcome: Progressing   Problem: Clinical Measurements: Goal: Ability to maintain clinical measurements within normal limits will improve Outcome: Progressing Goal: Will remain free from infection Outcome: Progressing Goal: Diagnostic test results will improve Outcome: Progressing Goal: Respiratory complications will improve Outcome: Progressing Goal: Cardiovascular complication will be avoided Outcome: Progressing   Problem: Activity: Goal: Risk for activity intolerance will decrease Outcome: Progressing   Problem: Nutrition: Goal: Adequate nutrition will be maintained Outcome: Progressing   Problem: Coping: Goal: Level of anxiety will decrease Outcome: Progressing   Problem: Elimination: Goal: Will not experience complications related to bowel motility Outcome: Progressing Goal: Will not experience complications related to urinary retention Outcome: Progressing   Problem: Pain Management: Goal: General experience of comfort will improve Outcome: Progressing   Problem: Safety: Goal: Ability to remain free from injury will improve Outcome: Progressing   Problem: Skin Integrity: Goal: Risk for impaired skin integrity will decrease Outcome: Progressing   Problem: Fluid Volume: Goal: Hemodynamic stability will improve Outcome: Progressing   Problem: Clinical Measurements: Goal: Diagnostic test results will improve Outcome: Progressing Goal: Signs and symptoms of infection will decrease Outcome: Progressing   Problem: Respiratory: Goal: Ability to maintain adequate ventilation will improve Outcome: Progressing   Problem: Education: Goal:  Ability to describe self-care measures that may prevent or decrease complications (Diabetes Survival Skills Education) will improve Outcome: Progressing Goal: Individualized Educational Video(s) Outcome: Progressing   Problem: Coping: Goal: Ability to adjust to condition or change in health will improve Outcome: Progressing   Problem: Fluid Volume: Goal: Ability to maintain a balanced intake and output will improve Outcome: Progressing   Problem: Health Behavior/Discharge Planning: Goal: Ability to identify and utilize available resources and services will improve Outcome: Progressing Goal: Ability to manage health-related needs will improve Outcome: Progressing   Problem: Metabolic: Goal: Ability to maintain appropriate glucose levels will improve Outcome: Progressing   Problem: Nutritional: Goal: Maintenance of adequate nutrition will improve Outcome: Progressing Goal: Progress toward achieving an optimal weight will improve Outcome: Progressing   Problem: Skin Integrity: Goal: Risk for impaired skin integrity will decrease Outcome: Progressing   Problem: Tissue Perfusion: Goal: Adequacy of tissue perfusion will improve Outcome: Progressing

## 2023-02-28 ENCOUNTER — Encounter (HOSPITAL_COMMUNITY)
Admission: EM | Disposition: A | Discharge status: Home / Self Care | Payer: Self-pay | Source: Home / Self Care | Attending: Internal Medicine

## 2023-02-28 ENCOUNTER — Inpatient Hospital Stay (HOSPITAL_COMMUNITY): Payer: 59 | Admitting: Anesthesiology

## 2023-02-28 ENCOUNTER — Other Ambulatory Visit: Payer: Self-pay

## 2023-02-28 ENCOUNTER — Telehealth: Payer: Self-pay | Admitting: Gastroenterology

## 2023-02-28 DIAGNOSIS — M869 Osteomyelitis, unspecified: Secondary | ICD-10-CM

## 2023-02-28 DIAGNOSIS — K219 Gastro-esophageal reflux disease without esophagitis: Secondary | ICD-10-CM | POA: Diagnosis not present

## 2023-02-28 DIAGNOSIS — D649 Anemia, unspecified: Secondary | ICD-10-CM | POA: Diagnosis not present

## 2023-02-28 DIAGNOSIS — L0390 Cellulitis, unspecified: Secondary | ICD-10-CM | POA: Diagnosis not present

## 2023-02-28 HISTORY — PX: AMPUTATION TOE: SHX6595

## 2023-02-28 LAB — GLUCOSE, CAPILLARY
Glucose-Capillary: 106 mg/dL — ABNORMAL HIGH (ref 70–99)
Glucose-Capillary: 173 mg/dL — ABNORMAL HIGH (ref 70–99)
Glucose-Capillary: 144 mg/dL — ABNORMAL HIGH (ref 70–99)
Glucose-Capillary: 167 mg/dL — ABNORMAL HIGH (ref 70–99)
Glucose-Capillary: 230 mg/dL — ABNORMAL HIGH (ref 70–99)
Glucose-Capillary: 284 mg/dL — ABNORMAL HIGH (ref 70–99)

## 2023-02-28 SURGERY — AMPUTATION, TOE
Anesthesia: General | Site: Toe | Laterality: Right

## 2023-02-28 MED ORDER — ROCURONIUM BROMIDE 10 MG/ML (PF) SYRINGE
PREFILLED_SYRINGE | INTRAVENOUS | Status: AC
  Filled 2023-02-28: qty 10

## 2023-02-28 MED ORDER — OXYCODONE HCL 5 MG PO TABS
5.0000 mg | ORAL_TABLET | ORAL | Status: DC | PRN
  Administered 2023-03-01: 10 mg via ORAL
  Administered 2023-03-01: 5 mg via ORAL
  Filled 2023-02-28: qty 1
  Filled 2023-02-28: qty 2

## 2023-02-28 MED ORDER — ONDANSETRON HCL 4 MG/2ML IJ SOLN: 4.0000 mg | Freq: Once | INTRAMUSCULAR | Status: DC | PRN

## 2023-02-28 MED ORDER — MORPHINE SULFATE (PF) 2 MG/ML IV SOLN: 2.0000 mg | INTRAVENOUS | Status: DC | PRN

## 2023-02-28 MED ORDER — FENTANYL CITRATE PF 50 MCG/ML IJ SOSY: 25.0000 ug | PREFILLED_SYRINGE | INTRAMUSCULAR | Status: DC | PRN

## 2023-02-28 MED ORDER — LIDOCAINE HCL (PF) 2 % IJ SOLN
INTRAMUSCULAR | Status: AC
Start: 2023-02-28 — End: ?
  Filled 2023-02-28: qty 5

## 2023-02-28 MED ORDER — DEXAMETHASONE SODIUM PHOSPHATE 10 MG/ML IJ SOLN
INTRAMUSCULAR | Status: AC
  Filled 2023-02-28: qty 1

## 2023-02-28 MED ORDER — LIDOCAINE HCL (PF) 2 % IJ SOLN
INTRAMUSCULAR | Status: DC | PRN
  Administered 2023-02-28: 100 mg via INTRADERMAL

## 2023-02-28 MED ORDER — PROPOFOL 10 MG/ML IV BOLUS
INTRAVENOUS | Status: AC
  Filled 2023-02-28: qty 20

## 2023-02-28 MED ORDER — DIPHENHYDRAMINE HCL 25 MG PO CAPS: 25.0000 mg | ORAL_CAPSULE | Freq: Three times a day (TID) | ORAL | Status: DC | PRN

## 2023-02-28 MED ORDER — BUPIVACAINE HCL (PF) 0.5 % IJ SOLN
INTRAMUSCULAR | Status: AC
Start: 2023-02-28 — End: ?
  Filled 2023-02-28: qty 30

## 2023-02-28 MED ORDER — MIDAZOLAM HCL 2 MG/2ML IJ SOLN
INTRAMUSCULAR | Status: DC | PRN
  Administered 2023-02-28: 2 mg via INTRAVENOUS

## 2023-02-28 MED ORDER — PIPERACILLIN-TAZOBACTAM 3.375 G IVPB 30 MIN: 3.3750 g | Freq: Once | INTRAVENOUS | Status: DC

## 2023-02-28 MED ORDER — CEFAZOLIN SODIUM-DEXTROSE 2-3 GM-%(50ML) IV SOLR
INTRAVENOUS | Status: DC | PRN
  Administered 2023-02-28: 2 g via INTRAVENOUS

## 2023-02-28 MED ORDER — GLYCOPYRROLATE PF 0.2 MG/ML IJ SOSY
PREFILLED_SYRINGE | INTRAMUSCULAR | Status: AC
Start: 2023-02-28 — End: ?
  Filled 2023-02-28: qty 1

## 2023-02-28 MED ORDER — OXYCODONE HCL 5 MG PO TABS: 5.0000 mg | ORAL_TABLET | Freq: Once | ORAL | Status: DC | PRN

## 2023-02-28 MED ORDER — CEFAZOLIN SODIUM-DEXTROSE 2-4 GM/100ML-% IV SOLN
INTRAVENOUS | Status: AC
  Filled 2023-02-28: qty 100

## 2023-02-28 MED ORDER — FENTANYL CITRATE (PF) 100 MCG/2ML IJ SOLN
INTRAMUSCULAR | Status: DC | PRN
  Administered 2023-02-28 (×2): 50 ug via INTRAVENOUS

## 2023-02-28 MED ORDER — SODIUM CHLORIDE 0.9 % IR SOLN
Status: DC | PRN
  Administered 2023-02-28: 1000 mL

## 2023-02-28 MED ORDER — OXYCODONE HCL 5 MG/5ML PO SOLN: 5.0000 mg | Freq: Once | ORAL | Status: DC | PRN

## 2023-02-28 MED ORDER — BUPIVACAINE HCL (PF) 0.5 % IJ SOLN
INTRAMUSCULAR | Status: DC | PRN
  Administered 2023-02-28: 20 mL

## 2023-02-28 MED ORDER — MIDAZOLAM HCL 2 MG/2ML IJ SOLN
INTRAMUSCULAR | Status: AC
  Filled 2023-02-28: qty 2

## 2023-02-28 MED ORDER — GLYCOPYRROLATE PF 0.2 MG/ML IJ SOSY
PREFILLED_SYRINGE | INTRAMUSCULAR | Status: DC | PRN
  Administered 2023-02-28: .2 mg via INTRAVENOUS

## 2023-02-28 MED ORDER — ONDANSETRON HCL 4 MG/2ML IJ SOLN
INTRAMUSCULAR | Status: AC
  Filled 2023-02-28: qty 2

## 2023-02-28 MED ORDER — SODIUM CHLORIDE 0.9 % IV SOLN: INTRAVENOUS | Status: DC | PRN

## 2023-02-28 MED ORDER — PROPOFOL 10 MG/ML IV BOLUS
INTRAVENOUS | Status: DC | PRN
  Administered 2023-02-28: 200 mg via INTRAVENOUS

## 2023-02-28 MED ORDER — FENTANYL CITRATE (PF) 100 MCG/2ML IJ SOLN
INTRAMUSCULAR | Status: AC
  Filled 2023-02-28: qty 2

## 2023-02-28 SURGICAL SUPPLY — 31 items
BLADE AVERAGE 25X9 (BLADE) ×2 IMPLANT
BLADE SURG 15 STRL LF DISP TIS (BLADE) ×2 IMPLANT
BNDG CONFORM 2 STRL LF (GAUZE/BANDAGES/DRESSINGS) ×2 IMPLANT
BNDG ELASTIC 4X5.8 VLCR NS LF (GAUZE/BANDAGES/DRESSINGS) ×2 IMPLANT
BNDG GAUZE ROLL STR 2.25X3YD (GAUZE/BANDAGES/DRESSINGS) IMPLANT
CLOTH BEACON ORANGE TIMEOUT ST (SAFETY) ×2 IMPLANT
COVER LIGHT HANDLE STERIS (MISCELLANEOUS) ×4 IMPLANT
CUFF TOURN SGL QUICK 18X4 (TOURNIQUET CUFF) ×2 IMPLANT
ELECT REM PT RETURN 9FT ADLT (ELECTROSURGICAL) ×1
ELECTRODE REM PT RTRN 9FT ADLT (ELECTROSURGICAL) ×2 IMPLANT
GAUZE SPONGE 4X4 12PLY STRL (GAUZE/BANDAGES/DRESSINGS) ×4 IMPLANT
GAUZE XEROFORM 1X8 LF (GAUZE/BANDAGES/DRESSINGS) IMPLANT
GLOVE BIO SURGEON STRL SZ 6.5 (GLOVE) ×2 IMPLANT
GLOVE BIOGEL PI IND STRL 6.5 (GLOVE) ×2 IMPLANT
GLOVE BIOGEL PI IND STRL 7.0 (GLOVE) ×4 IMPLANT
GOWN STRL REUS W/TWL LRG LVL3 (GOWN DISPOSABLE) ×4 IMPLANT
KIT TURNOVER KIT A (KITS) ×2 IMPLANT
MANIFOLD NEPTUNE II (INSTRUMENTS) ×2 IMPLANT
NDL HYPO 25X1 1.5 SAFETY (NEEDLE) ×2 IMPLANT
NEEDLE HYPO 25X1 1.5 SAFETY (NEEDLE) ×1 IMPLANT
NS IRRIG 1000ML POUR BTL (IV SOLUTION) ×2 IMPLANT
PACK BASIC LIMB (CUSTOM PROCEDURE TRAY) ×2 IMPLANT
PAD ARMBOARD 7.5X6 YLW CONV (MISCELLANEOUS) ×2 IMPLANT
POSITIONER HEAD 8X9X4 ADT (SOFTGOODS) ×2 IMPLANT
RASP SM TEAR CROSS CUT (RASP) ×2 IMPLANT
SET BASIN LINEN APH (SET/KITS/TRAYS/PACK) ×2 IMPLANT
SOL PREP PROV IODINE SCRUB 4OZ (MISCELLANEOUS) IMPLANT
SPONGE T-LAP 18X18 ~~LOC~~+RFID (SPONGE) ×2 IMPLANT
SUT 3-0 BLK 1X30 PSL (SUTURE) ×2 IMPLANT
SUT VIC AB 2-0 CT2 27 (SUTURE) ×2 IMPLANT
SYR CONTROL 10ML LL (SYRINGE) ×2 IMPLANT

## 2023-02-28 NOTE — Anesthesia Procedure Notes (Signed)
Procedure Name: LMA Insertion Date/Time: 02/28/2023 11:17 AM  Performed by: Julian Reil, CRNAPre-anesthesia Checklist: Patient identified, Emergency Drugs available, Suction available and Patient being monitored Patient Re-evaluated:Patient Re-evaluated prior to induction Oxygen Delivery Method: Circle system utilized Preoxygenation: Pre-oxygenation with 100% oxygen Induction Type: IV induction LMA: LMA inserted LMA Size: 5.0 Tube type: Oral Placement Confirmation: positive ETCO2 Tube secured with: Tape Dental Injury: Teeth and Oropharynx as per pre-operative assessment

## 2023-02-28 NOTE — Anesthesia Preprocedure Evaluation (Signed)
Anesthesia Evaluation  Patient identified by MRN, date of birth, ID band Patient awake    Reviewed: Allergy & Precautions, H&P , NPO status , Patient's Chart, lab work & pertinent test results, reviewed documented beta blocker date and time   Airway Mallampati: II  TM Distance: >3 FB Neck ROM: full    Dental no notable dental hx.    Pulmonary neg pulmonary ROS   Pulmonary exam normal breath sounds clear to auscultation       Cardiovascular Exercise Tolerance: Good hypertension, negative cardio ROS  Rhythm:regular Rate:Normal     Neuro/Psych negative neurological ROS  negative psych ROS   GI/Hepatic negative GI ROS, Neg liver ROS,GERD  ,,  Endo/Other  negative endocrine ROSdiabetes    Renal/GU negative Renal ROS  negative genitourinary   Musculoskeletal   Abdominal   Peds  Hematology negative hematology ROS (+) Blood dyscrasia, anemia   Anesthesia Other Findings   Reproductive/Obstetrics negative OB ROS                              Anesthesia Physical Anesthesia Plan  ASA: 3 and emergent  Anesthesia Plan: General and General LMA   Post-op Pain Management:    Induction:   PONV Risk Score and Plan: Ondansetron  Airway Management Planned:   Additional Equipment:   Intra-op Plan:   Post-operative Plan:   Informed Consent: I have reviewed the patients History and Physical, chart, labs and discussed the procedure including the risks, benefits and alternatives for the proposed anesthesia with the patient or authorized representative who has indicated his/her understanding and acceptance.     Dental Advisory Given  Plan Discussed with: CRNA  Anesthesia Plan Comments:         Anesthesia Quick Evaluation

## 2023-02-28 NOTE — Interval H&P Note (Signed)
History and Physical Interval Note:  02/28/2023 9:46 AM  Kevin Strong  has presented today for surgery, with the diagnosis of osteomyelitis.  The various methods of treatment have been discussed with the patient and family. After consideration of risks, benefits and other options for treatment, the patient has consented to  Procedure(s): AMPUTATION TOE; right 2nd toe (Right) as a surgical intervention.  The patient's history has been reviewed, patient examined, no change in status, stable for surgery.  I have reviewed the patient's chart and labs.  Questions were answered to the patient's satisfaction.    Amputation site  [name]

## 2023-02-28 NOTE — Plan of Care (Signed)
  Problem: Education: Goal: Knowledge of General Education information will improve Description: Including pain rating scale, medication(s)/side effects and non-pharmacologic comfort measures Outcome: Progressing   Problem: Coping: Goal: Level of anxiety will decrease Outcome: Progressing   Problem: Pain Management: Goal: General experience of comfort will improve Outcome: Progressing   Problem: Education: Goal: Knowledge of General Education information will improve Description: Including pain rating scale, medication(s)/side effects and non-pharmacologic comfort measures Outcome: Progressing   Problem: Coping: Goal: Level of anxiety will decrease Outcome: Progressing   Problem: Pain Management: Goal: General experience of comfort will improve Outcome: Progressing

## 2023-02-28 NOTE — Op Note (Signed)
[facility] Operative Note  02/28/23  Preoperative Diagnosis: Osteomyelitis 2nd right toe distal phalanx    Postoperative Diagnosis: Same   Procedure(s) Performed: Partial amputation of right 2nd toe removing middle and distal phalanx    Surgeon: Leatrice Jewels. Henreitta Leber, MD   Assistants: No qualified resident was available    Anesthesia: General endotracheal   Anesthesiologist: Dr. Johnnette Litter, MD    Specimens: Middle and distal phalanx of right 2nd toe    Estimated Blood Loss: Minimal   Blood Replacement: None    Complications: None   Wound Class: Dirty infected    Operative Indications: Mr. Longhurst is a 57 yo who has osteomyelitis of the right 2nd toe distal phalanx noted on MRI. Discussed risk of bleeding, worsening infection, wound issues, need for post operative care, and risk of future diabetic foot issues given his diabetes. He had palpable pulses.   Findings: Ulcerated tip of 2nd right toe, normal appearing proximal phalanx    Procedure: The patient was taken to the operating room and placed supine. General anesthesia was induced. Intravenous antibiotics were administered per protocol.  The right foot was prepped and draped in the usual sterile fashion.   The 2nd right toe was grasped at the tip with a towel clip. Bupivacaine digital block was performed. An incision on the skin above the proximal middle phalangeal joint was performed. This was carried down through the tendons and connective tissue. The middle phalanx was disarticulated from the proximal phalanx. The proximal phalanx was healthy. Irrigation was performed.  The tendons and space over the proximal phalanx was closed with 2-0 Vicryl interrupted suture. The skin was closed with interrupted 3-0 Nylon alternating  interrupted and vertical mattress suture to help with approximation.   Final inspection revealed acceptable hemostasis. All counts were correct at the end of the case. The patient was awakened  from anesthesia and extubated without complication.  The patient went to the PACU in stable condition.   Algis Greenhouse, MD [facility] [address] [place], [place] [address] [phone] (office)

## 2023-02-28 NOTE — Progress Notes (Signed)
[facility]  Updated his wife and team. Can dc tomorrow.  I would elevated the extremity as much as possible for the first 2 weeks (elevate foot above the heart).  Weight bear on your heel/ wear the post operative shoe if needed to help. You will likely need to be out of work until after the suture removal 12/30 unless work can give you restrictions.   POST OPERATIVE WOUND INSTRUCTIONS: Will need to weight bear on his heel and wear shoes that do not compress the toe like the post operative shoe.  Change dressing starting 03/02/2023. Replace the xeroform (yellow gauze), kerlix (wrap gauze), and ACE daily.  Do not soak the foot. Do not get the foot wet. You can place your foot in a plastic bag and  tape it up to prevent it from getting wet during a shower.  You can wipe the rest of your foot off with soap and rag but do not get excessive water on the sutures. You can clean the sutures with saline after 03/02/2023 RN should send you home with Xeroform gauze, kerlix wraps and ACE wrap to help you get started at home. Will plan to get the sutures out in the office 12/30.  Monitor for any signs of infection like worsening redness or swelling or purulent drainage.  Algis Greenhouse, MD [facility] [address] [place], [place] [address] [phone] (office)

## 2023-02-28 NOTE — Transfer of Care (Signed)
Immediate Anesthesia Transfer of Care Note  Patient: Kevin Strong  Procedure(s) Performed: AMPUTATION TOE; right 2nd toe (Right: Toe)  Patient Location: PACU  Anesthesia Type:General  Level of Consciousness: drowsy  Airway & Oxygen Therapy: Patient Spontanous Breathing and Patient connected to face mask oxygen  Post-op Assessment: Report given to RN and Post -op Vital signs reviewed and stable  Post vital signs: Reviewed and stable  Last Vitals:  Vitals Value Taken Time  BP 112/64 02/28/23 1147  Temp 97.2   Pulse 67 02/28/23 1148  Resp 18 02/28/23 1148  SpO2 95 % 02/28/23 1148  Vitals shown include unfiled device data.  Last Pain:  Vitals:   02/28/23 0952  TempSrc:   PainSc: 0-No pain         Complications: No notable events documented.

## 2023-02-28 NOTE — Progress Notes (Signed)
Progress Note   Patient: Kevin Strong NWG:[id number] DOB: 1965/08/10 DOA: 02/24/2023     4 DOS: the patient was seen and examined on 02/28/2023   Brief hospital admission course: Kevin Strong is a 57 y.o. male with medical history significant for type II diabetes mellitus, hypertension and dyslipidemia, presented to the emergency room with acute onset of worsening dyspnea which has been going on over the last month.  She has been having dyspnea on exertion for the last month that got significantly worse over the last several days.  He cannot go 6-8 steps without having worsening dyspnea.  He admitted to chest pain only with cough.  No fever or chills.    He was seen in [place] ER in October for similar symptoms and found to be anemic with with he was transfused and later transferred to [facility] where he was supposed to have an EGD but he was dissatisfied with the service and left AMA.  He has been having right second toe swelling since September.  He was seen for that in the ED here and was given p.o. doxycycline for toe infection.  This unfortunately has been getting worse with worsening swelling and mild tenderness with erythema.  This has been associated with foul smell drainage.  He denied any fever or chills.  No nausea or vomiting or abdominal pain.  No cough or wheezing or hemoptysis.  No adequate dizziness or blurred vision.  No dysuria, oliguria or hematuria or flank pain.   ED Course: Upon presentation to the emergency room, BP was 148/70 with heart rate 106 and respiratory to 22 and otherwise normal vital signs.  Labs revealed mild hyponatremia 134 and CO2 of 20 with blood glucose of 432 and high sensitive troponin I of 2 twice and BNP 27.  Lactic acid was 3.1 and CBC showed anemia with hemoglobin 7.8 hematocrit 27.5 compared to 10.8 and 34 on 11/26/2022 with microcytosis and thrombocytosis.  Blood group was O+ with negative antibody screen.  Stool Hemoccult came  back negative. EKG as reviewed by me : EKG showed normal sinus rhythm with a rate of 100. Imaging: Right second toe x-ray showed distal second toe soft tissue wound with underlying irregular cortical lucency of the second toe distal phalanx suspicious for osteomyelitis.  Assessment and Plan: * Symptomatic anemia, Iron deficiency.  - No overt bleeding appreciated -Fecal occult blood test was negative -Received one unit PRBC 12/06 -Continue to follow hemoglobin trend -GI service consulted. Underwent endoscopy 12/06: Small hiatal hernia, noncritical Schatzki ring, numerous superficially erosion second portion of the duodenal.  -Avoid NSAIDs.  Follow biopsy, needs Protonix twice daily. -Check anemia panel, iron at 14, B12 196.  Started iron and B12 supplementation. -Continue PPI. -Repeat CBC in AM.  Osteomyelitis of second toe of right foot (HCC) - Continue current IV antibiotics (Zosyn and vancomycin). -Case discussed with orthopedic surgery who recommended general surgery involvement for most likely amputation. -ABI: P and MRI: Active osteomyelitis of the distal phalanx second toe with bony destructive findings of the tuft, and abnormal edema signal and enhancement in the marrow. Overlying soft tissue defect favoring ulceration along the plantar distal margin of the toe extending to the bony margin. -Continue as needed analgesics and supportive care. -Plan for Sx later today (02/28/2023).   -Continue to follow general surgery recommendations.  Lactic acidosis/sepsis due to cellulitis (HCC) -Continue to maintain adequate hydration -Continue current IV antibiotic -Sepsis features resolved -Right second toe amputation on 02/28/2023.  Type 2 diabetes mellitus with hyperglycemia, with long-term current use of insulin (HCC) - Continue sliding scale insulin and Semglee -Follow CBG fluctuation and further adjust hypoglycemic regimen -Update A1c.  Dyslipidemia -Continue statin. -Heart healthy  diet discussed with patient.  Essential hypertension -Stable and well-controlled -Continue current antihypertensive regimen -Continue to follow vital signs.  Hypokalemia -Repleted -Continue to follow electrolytes trend. -Repeat be met in AM.  GERD without esophagitis -Continue PPI.  Class II obesity -Body mass index is 35.56 kg/m.  -Portion control, low calorie diet and increase physical activity discussed with patient.  Pseudohyponatremia present at time of admission -In the setting of hyperglycemia -Stabilize after controlling sugar and providing fluids -Corrected sodium within normal limits.   Subjective:  No overt bleeding reported.  Overall feeling better.  No nausea, no vomiting, no chest pain, no shortness of breath, no fever.  Ready for surgical intervention later today.  Physical Exam: Vitals:   02/28/23 1230 02/28/23 1238 02/28/23 1253 02/28/23 1729  BP: 125/85  130/89 115/78  Pulse: 73 67 62 72  Resp: 19 17 19 20   Temp:   97.6 F (36.4 C) (!) 97.5 F (36.4 C)  TempSrc:   Oral   SpO2: 96% 96% 98% 97%  Weight:      Height:       General exam: Alert, awake, oriented x 3; afebrile and in no acute distress. Respiratory system: Good saturation on room air. Cardiovascular system:RRR.  No rubs or gallops. Gastrointestinal system: Abdomen is obese, nondistended, soft and nontender. No organomegaly or masses felt. Normal bowel sounds heard. Central nervous system: No focal neurological deficits.  Patient reporting chronic intermittent neuropathy on his legs. Extremities: No cyanosis or clubbing. Skin: No petechiae.  Right second toe with improved cellulitic process and wet gangrene tip changes. Psychiatry: Judgement and insight appear normal. Mood & affect appropriate.   Data Reviewed: Basic metabolic panel: Sodium 134, potassium 3.6, chloride 107, bicarb 20, glucose 256, BUN 15, creatinine 0.84 and GFR >60 CBC: WBC 6.1, hemoglobin 7.4, platelet count  332K   Family Communication: Wife at bedside.  Disposition: Status is: Inpatient Remains inpatient appropriate because: Continue IV antibiotics and further workup for iron deficiency anemia; and anticipated second toe amputation on 02/28/2023.   Planned Discharge Destination: Home  Time spent: 50 minutes  Author: Vassie Loll, MD 02/28/2023 6:21 PM  For on call review [email or web address].

## 2023-02-28 NOTE — Telephone Encounter (Signed)
Mandy: needs outpatient hospital follow-up with [facility] in about 4 weeks. Thanks!

## 2023-02-28 NOTE — Discharge Instructions (Addendum)
You can call the office to get any FMLA paperwork completed. [phone].   I would elevated the extremity as much as possible for the first 2 weeks (elevate foot above the heart).  Weight bear on your heel/ wear the post operative shoe if needed to help. You will likely need to be out of work until after the suture removal 12/30 unless work can give you restrictions.   POST OPERATIVE WOUND INSTRUCTIONS: Will need to weight bear on his heel and wear shoes that do not compress the toe like the post operative shoe.  Change dressing starting 03/02/2023. Replace the xeroform (yellow gauze), kerlix (wrap gauze), and ACE daily.  Do not soak the foot. Do not get the foot wet. You can place your foot in a plastic bag and  tape it up to prevent it from getting wet during a shower.  You can wipe the rest of your foot off with soap and rag but do not get excessive water on the sutures. You can clean the sutures with saline after 03/02/2023 RN should send you home with Xeroform gauze, kerlix wraps and ACE wrap to help you get started at home. Will plan to get the sutures out in the office 12/30.  Monitor for any signs of infection like worsening redness or swelling or purulent drainage.   Pain Expectations and Narcotics: -After surgery you will have pain associated with your incisions and this is normal. The pain is muscular and nerve pain, and will get better with time. -You are encouraged and expected to take non narcotic medications like tylenol and ibuprofen (when able) to treat pain as multiple modalities can aid with pain treatment. -Narcotics are only used when pain is severe or there is breakthrough pain. -You are not expected to have a pain score of 0 after surgery, as we cannot prevent pain. A pain score of 3-4 that allows you to be functional, move, walk, and tolerate some activity is the goal. The pain will continue to improve over the days after surgery and is dependent on your surgery. -Due  to [place] law, we are only able to give a certain amount of pain medication to treat post operative pain, and we only give additional narcotics on a patient by patient basis.  -For most laparoscopic surgery, studies have shown that the majority of patients only need 10-15 narcotic pills, and for open surgeries most patients only need 15-20.   -Having appropriate expectations of pain and knowledge of pain management with non narcotics is important as we do not want anyone to become addicted to narcotic pain medication.  -Using ice packs in the first 48 hours and heating pads after 48 hours, wearing an abdominal binder (when recommended), and using over the counter medications are all ways to help with pain management.   -Simple acts like meditation and mindfulness practices after surgery can also help with pain control and research has proven the benefit of these practices.  Medication: Take tylenol and ibuprofen as needed for pain control, alternating every 4-6 hours.  Example:  Tylenol 1000mg  @ 6am, 12noon, 6pm, (Do not exceed 4000mg  of tylenol a day). Ibuprofen 800mg  @ 9am, 3pm, 9pm, 3am (Do not exceed 3600mg  of ibuprofen a day).  Take Roxicodone for breakthrough pain every 4 hours.  Take Colace for constipation related to narcotic pain medication. If you do not have a bowel movement in 2 days, take Miralax over the counter.  Drink plenty of water to also prevent constipation.  Contact Information: If you have questions or concerns, please call our office, [phone], Monday- Thursday 8AM-5PM and Friday 8AM-12Noon.  If it is after hours or on the weekend, please call [facility] Main Number, [phone], [phone], and ask to speak to the surgeon on call for Dr. Henreitta Leber at [facility].

## 2023-03-01 ENCOUNTER — Other Ambulatory Visit (HOSPITAL_COMMUNITY): Payer: Self-pay

## 2023-03-01 ENCOUNTER — Telehealth (HOSPITAL_COMMUNITY): Payer: Self-pay | Admitting: Pharmacy Technician

## 2023-03-01 DIAGNOSIS — R739 Hyperglycemia, unspecified: Secondary | ICD-10-CM

## 2023-03-01 DIAGNOSIS — E871 Hypo-osmolality and hyponatremia: Secondary | ICD-10-CM

## 2023-03-01 DIAGNOSIS — D649 Anemia, unspecified: Secondary | ICD-10-CM | POA: Diagnosis not present

## 2023-03-01 DIAGNOSIS — I10 Essential (primary) hypertension: Secondary | ICD-10-CM | POA: Diagnosis not present

## 2023-03-01 DIAGNOSIS — K219 Gastro-esophageal reflux disease without esophagitis: Secondary | ICD-10-CM | POA: Diagnosis not present

## 2023-03-01 DIAGNOSIS — E1165 Type 2 diabetes mellitus with hyperglycemia: Secondary | ICD-10-CM | POA: Diagnosis not present

## 2023-03-01 LAB — BASIC METABOLIC PANEL
Anion gap: 7 (ref 5–15)
BUN: 17 mg/dL (ref 6–20)
CO2: 21 mmol/L — ABNORMAL LOW (ref 22–32)
Calcium: 8.2 mg/dL — ABNORMAL LOW (ref 8.9–10.3)
Chloride: 109 mmol/L (ref 98–111)
Creatinine, Ser: 0.66 mg/dL (ref 0.61–1.24)
GFR, Estimated: >60 mL/min (ref >60)
Glucose, Bld: 195 mg/dL — ABNORMAL HIGH (ref 70–99)
Potassium: 3.6 mmol/L (ref 3.5–5.1)
Sodium: 137 mmol/L (ref 135–145)

## 2023-03-01 LAB — CBC
HCT: 27.5 % — ABNORMAL LOW (ref 39.0–52.0)
Hemoglobin: 7.7 g/dL — ABNORMAL LOW (ref 13.0–17.0)
MCH: 20.1 pg — ABNORMAL LOW (ref 26.0–34.0)
MCHC: 28 g/dL — ABNORMAL LOW (ref 30.0–36.0)
MCV: 71.8 fL — ABNORMAL LOW (ref 80.0–100.0)
Platelets: 331 10*3/uL (ref 150–400)
RBC: 3.83 MIL/uL — ABNORMAL LOW (ref 4.22–5.81)
RDW: 19.4 % — ABNORMAL HIGH (ref 11.5–15.5)
WBC: 6.3 10*3/uL (ref 4.0–10.5)
nRBC: 0 % (ref 0.0–0.2)

## 2023-03-01 LAB — CULTURE, BLOOD (ROUTINE X 2)
Culture: NO GROWTH
Culture: NO GROWTH
Special Requests: ADEQUATE

## 2023-03-01 LAB — GLUCOSE, CAPILLARY
Glucose-Capillary: 162 mg/dL — ABNORMAL HIGH (ref 70–99)
Glucose-Capillary: 183 mg/dL — ABNORMAL HIGH (ref 70–99)

## 2023-03-01 LAB — SURGICAL PATHOLOGY

## 2023-03-01 MED ORDER — POLYSACCHARIDE IRON COMPLEX 150 MG PO CAPS: 150.0000 mg | ORAL_CAPSULE | Freq: Every day | ORAL | 1 refills | Status: AC

## 2023-03-01 MED ORDER — OXYCODONE HCL 5 MG PO TABS: 5.0000 mg | ORAL_TABLET | Freq: Four times a day (QID) | ORAL | 0 refills | Status: DC | PRN

## 2023-03-01 MED ORDER — POLYETHYLENE GLYCOL 3350 17 G PO PACK: 17.0000 g | PACK | Freq: Every day | ORAL | 0 refills | Status: AC

## 2023-03-01 MED ORDER — DEXCOM G7 SENSOR MISC: 0 refills | Status: AC

## 2023-03-01 MED ORDER — PANTOPRAZOLE SODIUM 40 MG PO TBEC: 40.0000 mg | DELAYED_RELEASE_TABLET | Freq: Two times a day (BID) | ORAL | 3 refills | Status: AC

## 2023-03-01 MED ORDER — CYANOCOBALAMIN 1000 MCG PO TABS: 1000.0000 ug | ORAL_TABLET | Freq: Two times a day (BID) | ORAL | 2 refills | Status: AC

## 2023-03-01 NOTE — Anesthesia Postprocedure Evaluation (Signed)
Anesthesia Post Note  Patient: Kevin Strong  Procedure(s) Performed: AMPUTATION TOE; right 2nd toe (Right: Toe)  Patient location during evaluation: Phase II Anesthesia Type: General Level of consciousness: awake Pain management: pain level controlled Vital Signs Assessment: post-procedure vital signs reviewed and stable Respiratory status: spontaneous breathing and respiratory function stable Cardiovascular status: blood pressure returned to baseline and stable Postop Assessment: no headache and no apparent nausea or vomiting Anesthetic complications: no Comments: Late entry   No notable events documented.   Last Vitals:  Vitals:   03/01/23 0446 03/01/23 0748  BP: (!) 82/44 109/72  Pulse: 66 71  Resp:  20  Temp: 36.8 C 36.9 C  SpO2: 97% 98%    Last Pain:  Vitals:   03/01/23 0748  TempSrc: Oral  PainSc:                  Windell Norfolk

## 2023-03-01 NOTE — Telephone Encounter (Signed)
Patient Product/process development scientist completed.    The patient is insured through [facility]. Patient has [employer], may use a copay card, and/or apply for patient assistance if available.    Ran test claim for Dexcom G7 Sensor and the current 30 day co-pay is $0.00.   This test claim was processed through [facility]- copay amounts may vary at other pharmacies due to pharmacy/plan contracts, or as the patient moves through the different stages of their insurance plan.     Roland Earl, CPHT Pharmacy Technician III Certified Patient Advocate [facility] Patient Advocate Team Direct Number: [phone]  Fax: [phone]

## 2023-03-01 NOTE — Inpatient Diabetes Management (Addendum)
Inpatient Diabetes Program Recommendations  AACE/ADA: New Consensus Statement on Inpatient Glycemic Control   Target Ranges:  Prepandial:   less than 140 mg/dL      Peak postprandial:   less than 180 mg/dL (1-2 hours)      Critically ill patients:  140 - 180 mg/dL    Latest Reference Range & Units 02/28/23 09:46 02/28/23 11:48 02/28/23 16:09 02/28/23 20:47 03/01/23 07:24  Glucose-Capillary 70 - 99 mg/dL 829 (H) 562 (H) 130 (H) 284 (H) 162 (H)    Latest Reference Range & Units 02/25/23 01:29  Hemoglobin A1C 4.8 - 5.6 % 10.9 (H)    Review of Glycemic Control  Diabetes history: DM2 Outpatient Diabetes medications: Glipizide XL 5 mg QAM, Soliqua 60 units QHS Current orders for Inpatient glycemic control: Semglee 60 units at bedtime, Novolog 0-15 units TID with meals  Inpatient Diabetes Program Recommendations:    HbgA1C: A1C 10.9% on 02/25/23 indicating an average glucose of 266 mg/dl over the past 2-3 months. Per Care Everywhere, A1C was >16% on 01/07/23.   Outpatient: Please provide Rx for Dexcom G7 sensors ((#[id number]) at discharge.   Addendum 03/01/23@12 :15-Spoke with patient at bedside about diabetes and home regimen for diabetes control. Patient reports being followed by PCP for diabetes management and currently taking Glipizide XL 5 mg QAM and Soliquia 60 units at bedtime as an outpatient for diabetes control. Patient reports taking DM medications as prescribed.  Patient reports checking glucose 2-3 times per day and that it is usually in the 200's or higher.  Inquired about prior A1C and patient reports last A1C was in 13% range. Discussed A1C results (10.9% on 02/25/23) and explained that current A1C indicates an average glucose of 266 mg/dl over the past 2-3 months. Discussed glucose and A1C goals. Discussed importance of checking CBGs and maintaining good CBG control to prevent long-term and short-term complications. Explained how hyperglycemia leads to damage within blood vessels which  lead to the common complications seen with uncontrolled diabetes. Stressed to the patient the importance of improving glycemic control to prevent further complications from uncontrolled diabetes. Discussed impact of nutrition, exercise, stress, sickness, and medications on diabetes control.  Discussed carbohydrates, carbohydrate goals per day and meal, along with portion sizes. Patient plans to make dietary changes and is motivated to work towards getting DM under better control. Patient states that he use to use a Dexcom G7 sensor but he lost the receiver and notes the sensors kept coming off early. Discussed using phone app to read Dexcom G7. Discussed ways to keep sensor on (using skin tack, applying extra covering over sensor).  Patient would like to get restarted on Dexcom G7; will ask attending provider to give Rx at discharge.   Encouraged patient to monitor glucose closely and follow up with PCP regarding DM control. Patient states he has follow up appointment with PCP on December 15th.  Patient appreciative of information discussed and states he has no questions at this time.  Patient verbalized understanding of information discussed and reports no further questions at this time related to diabetes.  Thanks, Orlando Penner, RN, MSN, CDCES Diabetes Coordinator Inpatient Diabetes Program [phone] (Team Pager from 8am to 5pm)

## 2023-03-01 NOTE — Progress Notes (Signed)
Patient discharged home with instructions given on medications and follow up visits,patient verbalized understanding. Prescriptions sent to Pharmacy of choice documented on AVS. IV discontinued, catheter intact. Accompanied by staff to an awaiting vehicle.

## 2023-03-01 NOTE — Discharge Summary (Signed)
Physician Discharge Summary   Patient: Kevin Strong MRN: [id number] DOB: 1965-08-07  Admit date:     02/24/2023  Discharge date: 03/01/23  Discharge Physician: Vassie Loll   PCP: Elfredia Nevins, MD   Recommendations at discharge:  Continue close monitoring to patient's CBG/A1c with further adjustment to hypoglycemic regimen as needed Reassess blood pressure and adjust antihypertensive regimen as required Continue assistant with weight loss management. Make sure patient follow-up with general surgeon as instructed Repeat CBC to follow hemoglobin trend/stability Repeat basic metabolic panel to follow ultralights renal function.  Discharge Diagnoses: Principal Problem:   Symptomatic anemia Active Problems:   Osteomyelitis of second toe of right foot (HCC)   Sepsis due to cellulitis (HCC)   Type 2 diabetes mellitus with hyperglycemia, with long-term current use of insulin (HCC)   Dyslipidemia   GERD without esophagitis   Essential hypertension   Hyperglycemia   Hyponatremia  Brief hospital admission course: Kevin Strong is a 57 y.o. male with medical history significant for type II diabetes mellitus, hypertension and dyslipidemia, presented to the emergency room with acute onset of worsening dyspnea which has been going on over the last month.  She has been having dyspnea on exertion for the last month that got significantly worse over the last several days.  He cannot go 6-8 steps without having worsening dyspnea.  He admitted to chest pain only with cough.  No fever or chills.     He was seen in [place] ER in October for similar symptoms and found to be anemic with with he was transfused and later transferred to [facility] where he was supposed to have an EGD but he was dissatisfied with the service and left AMA.  He has been having right second toe swelling since September.  He was seen for that in the ED here and was given p.o. doxycycline for toe  infection.  This unfortunately has been getting worse with worsening swelling and mild tenderness with erythema.  This has been associated with foul smell drainage.  He denied any fever or chills.  No nausea or vomiting or abdominal pain.  No cough or wheezing or hemoptysis.  No adequate dizziness or blurred vision.  No dysuria, oliguria or hematuria or flank pain.   ED Course: Upon presentation to the emergency room, BP was 148/70 with heart rate 106 and respiratory to 22 and otherwise normal vital signs.  Labs revealed mild hyponatremia 134 and CO2 of 20 with blood glucose of 432 and high sensitive troponin I of 2 twice and BNP 27.  Lactic acid was 3.1 and CBC showed anemia with hemoglobin 7.8 hematocrit 27.5 compared to 10.8 and 34 on 11/26/2022 with microcytosis and thrombocytosis.  Blood group was O+ with negative antibody screen.  Stool Hemoccult came back negative. EKG as reviewed by me : EKG showed normal sinus rhythm with a rate of 100. Imaging: Right second toe x-ray showed distal second toe soft tissue wound with underlying irregular cortical lucency of the second toe distal phalanx suspicious for osteomyelitis.  Assessment and Plan:  Symptomatic anemia, Iron deficiency.  - No overt bleeding appreciated -Fecal occult blood test was negative -Received one unit PRBC 12/06 -Continue to follow hemoglobin trend -GI service consulted. Underwent endoscopy 12/06: Small hiatal hernia, noncritical Schatzki ring, numerous superficially erosion second portion of the duodenal.  -Avoid NSAIDs.  Follow biopsy, needs Protonix twice daily. -Check anemia panel, iron at 14, B12 196.  -Continue PPI. -Hemoglobin is stable at  discharge; 7.7 -Iron and B12 supplementation provided and will be continued at discharge. -Continue patient follow-up with GI service.   Osteomyelitis of second toe of right foot (HCC) -Case discussed with orthopedic surgery who recommended general surgery involvement for most likely  amputation. -MRI: Active osteomyelitis of the distal phalanx second toe with bony destructive findings of the tuft, and abnormal edema signal and enhancement in the marrow. Overlying soft tissue defect favoring ulceration along the plantar distal margin of the toe extending to the bony margin. -Continue as needed analgesics and supportive care. 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   -Continue to follow general surgery recommendations. -Continue as needed analgesics. -No antibiotics needed at discharge.   Lactic acidosis/sepsis due to cellulitis (HCC) -Continue to maintain adequate hydration -Continue current IV antibiotic -Sepsis features resolved -Right second toe amputation on 02/28/2023.   Type 2 diabetes mellitus with hyperglycemia, with long-term current use of insulin (HCC) - Modified carbohydrate diet and adequate hydration discussed with patient. -A1c 10.9 -Continue close monitoring of patient's CBG fluctuation with further adjustment to hypoglycemic regimen as required.   Dyslipidemia -Continue statin. -Heart healthy diet discussed with patient.   Essential hypertension -Stable and well-controlled -Continue current antihypertensive regimen.   Hypokalemia -Repleted -Continue to follow electrolytes trend. -Repeat be met in AM.   GERD without esophagitis -Continue PPI. -Lifestyle changes discussed with patient.   Class II obesity -Body mass index is 35.56 kg/m.  -Portion control, low calorie diet and increase physical activity discussed with patient.   Hyponatremia/pseudohyponatremia present at time of admission -In the setting of hyperglycemia -Stabilize after controlling sugar and providing fluids -Corrected sodium within normal limits. -Repeat basic metabolic panel at follow-up visit.  Consultants: General Surgery; gastroenterology service. Procedures performed: See below for x-ray report. Disposition: Home Diet recommendation:  Heart healthy/modified carbohydrate diet.  DISCHARGE MEDICATION: Allergies as of 03/01/2023   No Known Allergies      Medication List     STOP taking these medications    aspirin EC 81 MG tablet   bismuth subsalicylate 262 MG/15ML suspension Commonly known as: PEPTO BISMOL   clotrimazole-betamethasone cream Commonly known as: LOTRISONE   FreeStyle Libre 2 Reader Devi   ibuprofen 200 MG tablet Commonly known as: ADVIL   meloxicam 7.5 MG tablet Commonly known as: MOBIC   neomycin-bacitracin-polymyxin [phone] ointment   omeprazole 20 MG capsule Commonly known as: PRILOSEC       TAKE these medications    Accu-Chek Guide Me w/Device Kit 1 Piece by Does not apply route as directed.   Accu-Chek Guide test strip Generic drug: glucose blood Use as instructed   acetaminophen 500 MG tablet Commonly known as: TYLENOL Take 1,500 mg by mouth every 6 (six) hours as needed for moderate pain (pain score 4-6).   atorvastatin 40 MG tablet Commonly known as: LIPITOR Take 40 mg by mouth daily.   cyanocobalamin 1000 MCG tablet Take 1 tablet (1,000 mcg total) by mouth 2 (two) times daily.   Dexcom G7 Sensor Misc Use as instructed to check blood sugar and exchange every 7 days. What changed:  how much to take how to take this when to take this additional instructions   gabapentin 600 MG tablet Commonly known as: NEURONTIN Take 600 mg by mouth daily.   glipiZIDE 5 MG 24 hr tablet Commonly known as: GLUCOTROL XL Take 1 tablet (5 mg total) by mouth daily with breakfast.   Insulin Glargine-Lixisenatide 100-33 UNT-MCG/ML Sopn Inject 60 Units into the  skin at bedtime.   iron polysaccharides 150 MG capsule Commonly known as: NIFEREX Take 1 capsule (150 mg total) by mouth daily. Start taking on: March 02, 2023   lisinopril 2.5 MG tablet Commonly known as: ZESTRIL Take 2.5 mg by mouth daily.   oxyCODONE 5 MG immediate release tablet Commonly known as:  Roxicodone Take 1-2 tablets (5-10 mg total) by mouth every 6 (six) hours as needed for severe pain (pain score 7-10).   pantoprazole 40 MG tablet Commonly known as: Protonix Take 1 tablet (40 mg total) by mouth 2 (two) times daily.   polyethylene glycol 17 g packet Commonly known as: MIRALAX / GLYCOLAX Take 17 g by mouth daily. Start taking on: March 02, 2023        Follow-up Information     [name], MD Follow up on 03/21/2023.   Specialty: General Surgery Why: suture removal Contact information: [address] [place] [facility] [address] [phone]         Elfredia Nevins, MD. Schedule an appointment as soon as possible for a visit in 10 day(s).   Specialty: Internal Medicine Contact information: [address] [place] [address] [phone]                Discharge Exam: Ceasar Mons Weights   02/24/23 2230 02/25/23 1620 02/26/23 0500  Weight: 119.4 kg 119.4 kg 118.9 kg   General exam: Alert, awake, oriented x 3 Respiratory system: Clear to auscultation. Respiratory effort normal. Cardiovascular system:RRR. No murmurs, rubs, gallops. Gastrointestinal system: Abdomen is obese, nondistended, soft and nontender. No organomegaly or masses felt. Normal bowel sounds heard. Central nervous system: Alert and oriented. No focal neurological deficits. Extremities: No cyanosis, clubbing or significant edema. Skin: No petechiae; clean dressings and dry cough shoe in place after right second toe distal phalangeal amputation. Psychiatry: Judgement and insight appear normal. Mood & affect appropriate.    Condition at discharge: Stable and improved.  The results of significant diagnostics from this hospitalization (including imaging, microbiology, ancillary and laboratory) are listed below for reference.   Imaging Studies: MR FOOT RIGHT W WO CONTRAST  Result Date: 02/25/2023 CLINICAL DATA:  Osteomyelitis. Diabetes. Erythema and swelling of the  second EXAM: MRI OF THE RIGHT FOREFOOT WITHOUT AND WITH CONTRAST TECHNIQUE: Multiplanar, multisequence MR imaging of the right forefoot was performed before and after the administration of intravenous contrast. CONTRAST:  10mL GADAVIST GADOBUTROL 1 MMOL/ML IV SOLN COMPARISON:  02/24/2023 radiographs FINDINGS: Bones/Joint/Cartilage Active osteomyelitis distal phalanx second toe with bony destructive findings of the tuft, and abnormal edema signal and enhancement in the marrow. Overlying soft tissue defect favoring ulceration along the plantar distal margin of the toe extending to the bony margin on image 12 series 12. Trace edema signal medially in the medial sesamoid of the first digit is likely degenerative. Small degenerative subcortical cystic lesion along the lateral head of the proximal phalanx great toe, image 8 series 5. Ligaments No findings of turf toe or plantar plate injury. Muscles and Tendons Low-level edema tracks along otherwise atrophic regional musculature, likely neuropathic. Soft tissues Dorsal subcutaneous edema in the forefoot favoring cellulitis. Subcutaneous edema and soft tissue swelling in the second toe compatible with cellulitis. Plantar ulceration along the distal second toe extending to the bony margin of the distal phalanx. IMPRESSION: 1. Active osteomyelitis of the distal phalanx second toe with bony destructive findings of the tuft, and abnormal edema signal and enhancement in the marrow. Overlying soft tissue defect favoring ulceration along the plantar distal margin  of the toe extending to the bony margin. 2. Dorsal subcutaneous edema in the forefoot favoring cellulitis. 3. Low-level edema tracks along otherwise atrophic regional musculature, likely neuropathic. Electronically Signed   By: Gaylyn Rong M.D.   On: 02/25/2023 17:24   US ARTERIAL ABI (SCREENING LOWER EXTREMITY)  Result Date: 02/25/2023 CLINICAL DATA:  Right second toe redness, swelling, warmth, tenderness,  drainage with odor Hypertension Diabetes Hyperlipidemia EXAM: NONINVASIVE PHYSIOLOGIC VASCULAR STUDY OF BILATERAL LOWER EXTREMITIES TECHNIQUE: Evaluation of both lower extremities were performed at rest, including calculation of ankle-brachial indices with single level pressure measurements and doppler recording. COMPARISON:  None available. FINDINGS: Right ABI:  1.46 Left ABI:  1.41 Right Lower Extremity:  Normal arterial waveforms at the ankle. Left Lower Extremity:  Normal arterial waveforms at the ankle. > 1.4 Non diagnostic secondary to incompressible vessel calcifications (medial arterial sclerosis of [place]cke[place]) IMPRESSION: Nondiagnostic examination of the lower extremity arteries due to incompressible vessels. If there is continued concern for arterial stenosis, further evaluation with CT angiography would be beneficial. Electronically Signed   By: Acquanetta Belling M.D.   On: 02/25/2023 10:08   CT Angio Chest PE W and/or Wo Contrast  Result Date: 02/24/2023 CLINICAL DATA:  Concern for pulmonary edema. EXAM: CT ANGIOGRAPHY CHEST WITH CONTRAST TECHNIQUE: Multidetector CT imaging of the chest was performed using the standard protocol during bolus administration of intravenous contrast. Multiplanar CT image reconstructions and MIPs were obtained to evaluate the vascular anatomy. RADIATION DOSE REDUCTION: This exam was performed according to the departmental dose-optimization program which includes automated exposure control, adjustment of the mA and/or kV according to patient size and/or use of iterative reconstruction technique. CONTRAST:  75mL OMNIPAQUE IOHEXOL 350 MG/ML SOLN COMPARISON:  Chest radiograph dated 02/24/2023. FINDINGS: Cardiovascular: There is no cardiomegaly or pericardial effusion. There is 3 vessel coronary vascular calcification. The thoracic aorta is unremarkable. The origins of the great vessels of the aortic arch appear patent. Evaluation of the pulmonary arteries is limited due to  respiratory motion and suboptimal opacification and timing of the contrast. No obvious large or central pulmonary artery embolus identified. Mediastinum/Nodes: No hilar or mediastinal adenopathy. The esophagus is grossly unremarkable. No mediastinal fluid collection. Lungs/Pleura: The lungs are clear. There is no pleural effusion pneumothorax. The central airways are patent. Upper Abdomen: No acute abnormality. Musculoskeletal: No acute osseous pathology. Review of the MIP images confirms the above findings. IMPRESSION: 1. No acute intrathoracic pathology. No central pulmonary artery embolus. 2. Coronary vascular calcification. Electronically Signed   By: Elgie Collard M.D.   On: 02/24/2023 19:59   DG Toe 2nd Right  Result Date: 02/24/2023 CLINICAL DATA:  Redness and swelling of the toe EXAM: RIGHT SECOND TOE COMPARISON:  Right second toe radiograph dated 01/31/2020 FINDINGS: Soft tissue edema about the second toe with irregular at the distal aspect. Irregular cortical lucency underlying the soft tissue wound involving the second toe distal phalanx. There is no evidence of fracture or dislocation. IMPRESSION: Distal second toe soft tissue wound with underlying irregular cortical lucency of the second toe distal phalanx, suspicious for osteomyelitis. Electronically Signed   By: Agustin Cree M.D.   On: 02/24/2023 16:52   DG Chest 2 View  Result Date: 02/24/2023 CLINICAL DATA:  Shortness of breath. EXAM: CHEST - 2 VIEW COMPARISON:  01/07/2023. FINDINGS: Bilateral lung fields are clear. Bilateral costophrenic angles are clear. Normal cardio-mediastinal silhouette. No acute osseous abnormalities. The soft tissues are within normal limits. IMPRESSION: No active cardiopulmonary disease. Electronically Signed  By: Jules Schick M.D.   On: 02/24/2023 14:13    Microbiology: Results for orders placed or performed during the hospital encounter of 02/24/23  Resp panel by RT-PCR (RSV, Flu A&B, Covid) Anterior Nasal  Swab     Status: None   Collection Time: 02/24/23 12:14 PM   Specimen: Anterior Nasal Swab  Result Value Ref Range Status   SARS Coronavirus 2 by RT PCR NEGATIVE NEGATIVE Final    Comment: (NOTE) SARS-CoV-2 target nucleic acids are NOT DETECTED.  The SARS-CoV-2 RNA is generally detectable in upper respiratory specimens during the acute phase of infection. The lowest concentration of SARS-CoV-2 viral copies this assay can detect is 138 copies/mL. A negative result does not preclude SARS-Cov-2 infection and should not be used as the sole basis for treatment or other patient management decisions. A negative result may occur with  improper specimen collection/handling, submission of specimen other than nasopharyngeal swab, presence of viral mutation(s) within the areas targeted by this assay, and inadequate number of viral copies(<138 copies/mL). A negative result must be combined with clinical observations, patient history, and epidemiological information. The expected result is Negative.  Fact Sheet for Patients:  BloggerCourse.com  Fact Sheet for Healthcare Providers:  SeriousBroker.it  This test is no t yet approved or cleared by the [place] FDA and  has been authorized for detection and/or diagnosis of SARS-CoV-2 by FDA under an Emergency Use Authorization (EUA). This EUA will remain  in effect (meaning this test can be used) for the duration of the COVID-19 declaration under Section 564(b)(1) of the Act, 21 U.S.C.section 360bbb-3(b)(1), unless the authorization is terminated  or revoked sooner.       Influenza A by PCR NEGATIVE NEGATIVE Final   Influenza B by PCR NEGATIVE NEGATIVE Final    Comment: (NOTE) The Xpert Xpress SARS-CoV-2/FLU/RSV plus assay is intended as an aid in the diagnosis of influenza from Nasopharyngeal swab specimens and should not be used as a sole basis for treatment. Nasal washings and aspirates  are unacceptable for Xpert Xpress SARS-CoV-2/FLU/RSV testing.  Fact Sheet for Patients: BloggerCourse.com  Fact Sheet for Healthcare Providers: SeriousBroker.it  This test is not yet approved or cleared by the [place] FDA and has been authorized for detection and/or diagnosis of SARS-CoV-2 by FDA under an Emergency Use Authorization (EUA). This EUA will remain in effect (meaning this test can be used) for the duration of the COVID-19 declaration under Section 564(b)(1) of the Act, 21 U.S.C. section 360bbb-3(b)(1), unless the authorization is terminated or revoked.     Resp Syncytial Virus by PCR NEGATIVE NEGATIVE Final    Comment: (NOTE) Fact Sheet for Patients: BloggerCourse.com  Fact Sheet for Healthcare Providers: SeriousBroker.it  This test is not yet approved or cleared by the [place] FDA and has been authorized for detection and/or diagnosis of SARS-CoV-2 by FDA under an Emergency Use Authorization (EUA). This EUA will remain in effect (meaning this test can be used) for the duration of the COVID-19 declaration under Section 564(b)(1) of the Act, 21 U.S.C. section 360bbb-3(b)(1), unless the authorization is terminated or revoked.  Performed at [facility], [address], [place], [place] [address]   Blood Cultures x 2 sites     Status: None   Collection Time: 02/24/23  6:29 PM   Specimen: BLOOD  Result Value Ref Range Status   Specimen Description BLOOD BLOOD RIGHT HAND  Final   Special Requests   Final    BOTTLES DRAWN AEROBIC AND ANAEROBIC Blood  Culture adequate volume   Culture   Final    NO GROWTH 5 DAYS Performed at [facility], [address], [place], [place] [address]    Report Status 03/01/2023 FINAL  Final  Blood Cultures x 2 sites     Status: None   Collection Time: 02/24/23  6:29 PM   Specimen: BLOOD  Result Value Ref Range Status    Specimen Description BLOOD LEFT ANTECUBITAL  Final   Special Requests   Final    BOTTLES DRAWN AEROBIC AND ANAEROBIC Blood Culture results may not be optimal due to an inadequate volume of blood received in culture bottles   Culture   Final    NO GROWTH 5 DAYS Performed at [facility], [address] [place], [place] [address]    Report Status 03/01/2023 FINAL  Final  Surgical PCR screen     Status: None   Collection Time: 02/27/23 10:08 AM   Specimen: Nasal Mucosa; Nasal Swab  Result Value Ref Range Status   MRSA, PCR NEGATIVE NEGATIVE Final   Staphylococcus aureus NEGATIVE NEGATIVE Final    Comment: (NOTE) The Xpert SA Assay (FDA approved for NASAL specimens in patients 43 years of age and older), is one component of a comprehensive surveillance program. It is not intended to diagnose infection nor to guide or monitor treatment. Performed at [facility], [address] [place], [place] [address]     Labs: CBC: Recent Labs  Lab 02/24/23 1301 02/25/23 0129 02/26/23 0425 02/27/23 0346 03/01/23 0410  WBC 8.9 7.3 7.1 6.1 6.3  NEUTROABS 5.2  --   --   --   --   HGB 7.8* 7.1* 7.6* 7.4* 7.7*  HCT 27.5* 26.8* 28.7* 27.7* 27.5*  MCV 69.6* 70.2* 72.8* 72.5* 71.8*  PLT 466* 360 370 332 331   Basic Metabolic Panel: Recent Labs  Lab 02/24/23 1301 02/24/23 1515 02/25/23 0129 02/25/23 1955 02/27/23 0346 03/01/23 0410  NA 134*  --  137 137 134* 137  K 3.9  --  3.7 3.4* 3.6 3.6  CL 101  --  106 108 107 109  CO2 20*  --  20* 20* 20* 21*  GLUCOSE 432*  --  326* 200* 256* 195*  BUN 19  --  17 13 15 17   CREATININE 1.00  --  0.84 0.73 0.84 0.66  CALCIUM 8.9  --  8.5* 8.3* 8.1* 8.2*  MG  --  2.2  --   --   --   --    Liver Function Tests: Recent Labs  Lab 02/24/23 1301  AST 13*  ALT 13  ALKPHOS 74  BILITOT 0.2  PROT 7.4  ALBUMIN 3.6   CBG: Recent Labs  Lab 02/28/23 1148 02/28/23 1609 02/28/23 2047 03/01/23 0724 03/01/23 1109  GLUCAP 167* 230* 284* 162* 183*     Discharge time spent: greater than 30 minutes.  Signed: Vassie Loll, MD [facility] 03/01/2023

## 2023-03-01 NOTE — Progress Notes (Signed)
[facility]  Doing good. Had to take some pain medication but after that better.  BP 109/72 (BP Location: Left Arm)   Pulse 71   Temp 98.5 F (36.9 C) (Oral)   Resp 20   Ht 6' (1.829 m)   Wt 118.9 kg   SpO2 98%   BMI 35.56 kg/m  Dressing in place  Patient s/p right partial toe amputation of the 2nd toe. Doing well. Dressing changes explained, will need supplies at discharge.  Out of work until after seeing me in office.   Future Appointments  Date Time Provider Department Center  03/21/2023  9:30 AM [name], MD [facility] None  03/29/2023 11:30 AM Aida Raider, NP [facility]     Algis Greenhouse, MD [facility] [address] [place], [place] [address] [phone] (office)

## 2023-03-02 ENCOUNTER — Encounter (HOSPITAL_COMMUNITY): Payer: Self-pay | Admitting: Internal Medicine

## 2023-03-02 LAB — SURGICAL PATHOLOGY

## 2023-03-03 NOTE — Progress Notes (Signed)
Small focal area of osteo, remaining bone is healthy.

## 2023-03-21 ENCOUNTER — Ambulatory Visit (INDEPENDENT_AMBULATORY_CARE_PROVIDER_SITE_OTHER): Payer: 59 | Admitting: General Surgery

## 2023-03-21 ENCOUNTER — Encounter: Payer: Self-pay | Admitting: General Surgery

## 2023-03-21 ENCOUNTER — Other Ambulatory Visit: Payer: Self-pay

## 2023-03-21 ENCOUNTER — Encounter: Payer: Self-pay | Admitting: *Deleted

## 2023-03-21 VITALS — BP 139/80 | HR 84 | Temp 98.3°F | Resp 18 | Ht 72.0 in | Wt 262.0 lb

## 2023-03-21 DIAGNOSIS — M869 Osteomyelitis, unspecified: Secondary | ICD-10-CM

## 2023-03-21 NOTE — Patient Instructions (Addendum)
Continue to keep area covered when showering.  Use betadine and packing to pack/ clean out the q tip sized opening on the wound daily.  Once it cannot be packed, cleanse area with betadine daily.  Call with any worsening redness, pain, purulent drainage or changes. Ok to return to work and when not working keep foot elevated.

## 2023-03-21 NOTE — Progress Notes (Signed)
[facility]  Doing well. Ready to go back to work. No issues.  BP 139/80   Pulse 84   Temp 98.3 F (36.8 C) (Oral)   Resp 18   Ht 6' (1.829 m) Comment: patient reported  Wt 262 lb (118.8 kg) Comment: patient reported  SpO2 96%   BMI 35.53 kg/m  Slightly bruised, no erythema or drainage, lateral edge of incision minor dehiscence ~63mm in size, about the size of a qtip head. No drainage, packed with betadine gauze Sutures removed   Patient s/p right 2nd toe partial amputation. Doing well. Partial dehiscence of the wound laterally.   Continue to keep area covered when showering.  Use betadine and packing to pack/ clean out the q tip sized opening on the wound daily.  Once it cannot be packed, cleanse area with betadine daily.  Call with any worsening redness, pain, purulent drainage or changes. Ok to return to work and when not working keep foot elevated.  Future Appointments  Date Time Provider Department Center  03/29/2023 11:30 AM Aida Raider, NP [facility]  03/31/2023  3:30 PM [name], MD [facility] None   Algis Greenhouse, MD [facility] [address] [place], [place] [address] [phone] (office)

## 2023-03-24 ENCOUNTER — Telehealth: Payer: Self-pay | Admitting: *Deleted

## 2023-03-24 NOTE — Telephone Encounter (Signed)
 Pt called and wanted to know why he needed an OV to schedule a colonoscopy. I tried to explain the reasoning why he needed to come in to the office. He became rude and told me that he would speak to his PCP (Dr.Fusco) and get him to schedule his colonoscopy, because there was no need for him to take off of work. He states we were just trying to take his money. I informed him that was not the case. I asked him if he would like for me to cancel his up coming OV. He stated yes, because he would not be here it was a waste of time.

## 2023-03-28 NOTE — Telephone Encounter (Signed)
 Pt was made aware and appt was cancelled. Ready to move forward with scheduling.

## 2023-03-28 NOTE — Telephone Encounter (Signed)
 Noted.

## 2023-03-29 ENCOUNTER — Inpatient Hospital Stay: Payer: 59 | Admitting: Gastroenterology

## 2023-03-29 ENCOUNTER — Encounter: Payer: Self-pay | Admitting: *Deleted

## 2023-03-29 MED ORDER — PEG 3350-KCL-NA BICARB-NACL 420 G PO SOLR: 4000.0000 mL | Freq: Once | ORAL | 0 refills | Status: AC

## 2023-03-29 NOTE — Telephone Encounter (Signed)
 SPOKE WITH PT. Scheduled for 2/19. Aware will send instructions to him. Rx for prep to be sent to pharmacy. Aware will call back with pre-op appt. Also aware of medications that will need ot be held prior and to start miralax 5 days prior.

## 2023-03-29 NOTE — Telephone Encounter (Signed)
 LMTCB with pre-op appt and lette rmailed

## 2023-03-29 NOTE — Addendum Note (Signed)
 Addended by: Armstead Peaks on: 03/29/2023 09:15 AM   Modules accepted: Orders

## 2023-03-31 ENCOUNTER — Ambulatory Visit (INDEPENDENT_AMBULATORY_CARE_PROVIDER_SITE_OTHER): Payer: 59 | Admitting: General Surgery

## 2023-03-31 VITALS — BP 142/81 | HR 79 | Temp 97.9°F | Resp 18 | Ht 72.0 in | Wt 279.0 lb

## 2023-03-31 DIAGNOSIS — M869 Osteomyelitis, unspecified: Secondary | ICD-10-CM

## 2023-03-31 MED ORDER — DOXYCYCLINE HYCLATE 50 MG PO CAPS: 50.0000 mg | ORAL_CAPSULE | Freq: Two times a day (BID) | ORAL | 0 refills | Status: DC

## 2023-03-31 NOTE — Progress Notes (Signed)
 [facility]  Toe with more swelling and slight redness. Has been ambulating and working. No drainage reported.  BP (!) 142/81   Pulse 79   Temp 97.9 F (36.6 C) (Oral)   Resp 18   Ht 6' (1.829 m)   Wt 279 lb (126.6 kg)   SpO2 95%   BMI 37.84 kg/m  Slight edematous and swollen, slightly red, squeezed and no drainage expressed  Patient s/p partial toe amputation for osteomyelitis. I am worried he could be getting a little local infection.   Betadine to the wound daily. Keep dry and do not soak.  Take tylenol  and ibuprofen for pain. Elevate as much as possible. Take the antibiotic to safe that no infection is starting.  Wear sunscreen if out in the sun while take doxycycline .  Call with changes  Future Appointments  Date Time Provider Department Center  03/31/2023  3:30 PM Kallie Manuelita BROCKS, MD [facility] None  04/14/2023  2:45 PM Kallie Manuelita BROCKS, MD [facility] None  05/06/2023  9:30 AM [facility] PAT 2 [facility] None   Manuelita Kallie, MD [facility] [address] [place], [place] [address] [phone] (office)

## 2023-03-31 NOTE — Patient Instructions (Addendum)
 Betadine to the wound daily. Keep dry and do not soak.  Take tylenol and ibuprofen for pain. Elevate as much as possible. Take the antibiotic to safe that no infection is starting.  Wear sunscreen if out in the sun while take doxycycline.

## 2023-04-04 ENCOUNTER — Telehealth: Payer: Self-pay | Admitting: *Deleted

## 2023-04-04 NOTE — Telephone Encounter (Signed)
 Discussed with Dr.Bridges. recommended appointment to evaluate this week.   Call placed to patient. LMTRC.

## 2023-04-04 NOTE — Telephone Encounter (Signed)
Patient returned call and made aware.   Appointment scheduled.   

## 2023-04-04 NOTE — Telephone Encounter (Signed)
 Surgical Date: 02/28/2023 Procedure: Amputation, Right Second Toe  Received call from patient [phone]~ telephone.  Patient reports intense pressure behind wound of toe amputation over the weekend. States that toe was noted swollen and red. States that he called [facility] to page Dr. Kallie, but was routed to ER provider instead with no recommendations for care.   Patient reports that he began manually debriding dead skin from wound and noted hardened white area at the end of the incision. States that he removed hardened area and copious purulent drainage was removed. States that he milked toe to remove purulent drainage and intense pressure was relieved immediately. Reports that toe remains tender to touch, and is slightly painful when walking, but the toe has returned to normal size and color.   Reports that he is taking Doxycycline  100 mg PO BID. States that he should complete 10 day course on 04/10/2023. Requested extension of ABTx until his appointment on 04/14/2023.   Please advise.

## 2023-04-06 ENCOUNTER — Encounter: Payer: Self-pay | Admitting: General Surgery

## 2023-04-06 ENCOUNTER — Ambulatory Visit (INDEPENDENT_AMBULATORY_CARE_PROVIDER_SITE_OTHER): Payer: 59 | Admitting: General Surgery

## 2023-04-06 VITALS — BP 153/84 | HR 94 | Temp 98.3°F | Resp 16 | Ht 72.0 in | Wt 277.0 lb

## 2023-04-06 DIAGNOSIS — M869 Osteomyelitis, unspecified: Secondary | ICD-10-CM

## 2023-04-06 MED ORDER — TRAMADOL HCL 50 MG PO TABS: 50.0000 mg | ORAL_TABLET | Freq: Four times a day (QID) | ORAL | 0 refills | Status: AC | PRN

## 2023-04-06 MED ORDER — AMOXICILLIN-POT CLAVULANATE 875-125 MG PO TABS: 1.0000 | ORAL_TABLET | Freq: Two times a day (BID) | ORAL | 0 refills | Status: AC

## 2023-04-06 NOTE — Progress Notes (Signed)
 [facility]  Toe drained on Friday. Purulence expressed. Swelling improved. Pain improved.  Has been on doxycyline.  BP (!) 153/84   Pulse 94   Temp 98.3 F (36.8 C) (Oral)   Resp 16   Ht 6' (1.829 m)   Wt 277 lb (125.6 kg)   SpO2 97%   BMI 37.57 kg/m  Right second toe-Less swelling and redness, no purulence expressed today   Patient with what we hope was superficial wound infection after partial toe amputation for osteomyelitis. Improving.   Continue betadine Take augmentin  Elevate the foot as much as possible  If does not get better may have to repeat MRI to see if need further amputation.  Future Appointments  Date Time Provider Department Center  04/21/2023  2:45 PM Awilda Bogus, MD [facility] None  05/06/2023  9:30 AM [facility] PAT 2 [facility] None   Deena Farrier, MD [facility] [address] [place] [place], [place] [address] [phone] (office)

## 2023-04-06 NOTE — Patient Instructions (Signed)
 Continue betadine Take augmentin  Elevate the foot as much as possible

## 2023-04-14 ENCOUNTER — Encounter: Payer: 59 | Admitting: General Surgery

## 2023-04-21 ENCOUNTER — Encounter: Payer: 59 | Admitting: General Surgery

## 2023-05-04 NOTE — Patient Instructions (Signed)
   Your procedure is scheduled on: 05/11/2023  Report to [facility] Main Entrance at   6:45  AM.  Call this number if you have problems the morning of surgery: [phone]   Remember:              Follow Directions on the letter you received from Your Physician's office regarding the Bowel Prep              No Smoking the day of Procedure :   Take these medicines the morning of surgery with A SIP OF WATER: gabapentin, pantoprazole, Tramadol if needed  No diabetic medication am of procedure  Take only 1/2 dose of Glargine insulin 30 units the night before procedure   Do not wear jewelry, make-up or nail polish.    Do not bring valuables to the hospital.  Contacts, dentures or bridgework may not be worn into surgery.  .   Patients discharged the day of surgery will not be allowed to drive home.     Colonoscopy, Adult, Care After This sheet gives you information about how to care for yourself after your procedure. Your health care provider may also give you more specific instructions. If you have problems or questions, contact your health care provider. What can I expect after the procedure? After the procedure, it is common to have: A small amount of blood in your stool for 24 hours after the procedure. Some gas. Mild abdominal cramping or bloating.  Follow these instructions at home: General instructions  For the first 24 hours after the procedure: Do not drive or use machinery. Do not sign important documents. Do not drink alcohol. Do your regular daily activities at a slower pace than normal. Eat soft, easy-to-digest foods. Rest often. Take over-the-counter or prescription medicines only as told by your health care provider. It is up to you to get the results of your procedure. Ask your health care provider, or the department performing the procedure, when your results will be ready. Relieving cramping and bloating Try walking around when you have cramps or feel  bloated. Apply heat to your abdomen as told by your health care provider. Use a heat source that your health care provider recommends, such as a moist heat pack or a heating pad. Place a towel between your skin and the heat source. Leave the heat on for 20-30 minutes. Remove the heat if your skin turns bright red. This is especially important if you are unable to feel pain, heat, or cold. You may have a greater risk of getting burned. Eating and drinking Drink enough fluid to keep your urine clear or pale yellow. Resume your normal diet as instructed by your health care provider. Avoid heavy or fried foods that are hard to digest. Avoid drinking alcohol for as long as instructed by your health care provider. Contact a health care provider if: You have blood in your stool 2-3 days after the procedure. Get help right away if: You have more than a small spotting of blood in your stool. You pass large blood clots in your stool. Your abdomen is swollen. You have nausea or vomiting. You have a fever. You have increasing abdominal pain that is not relieved with medicine. This information is not intended to replace advice given to you by your health care provider. Make sure you discuss any questions you have with your health care provider. Document Released: 10/21/2003 Document Revised: 12/01/2015 Document Reviewed: 05/20/2015 Elsevier Interactive Patient Education  [employer].

## 2023-05-06 ENCOUNTER — Encounter (HOSPITAL_COMMUNITY)
Admission: RE | Admit: 2023-05-06 | Discharge: 2023-05-06 | Disposition: A | Discharge status: Home / Self Care | Payer: 59 | Source: Ambulatory Visit | Attending: Internal Medicine | Admitting: Internal Medicine

## 2023-05-06 ENCOUNTER — Encounter (HOSPITAL_COMMUNITY): Payer: Self-pay

## 2023-05-06 VITALS — BP 120/78 | HR 70 | Temp 97.8°F | Resp 18 | Ht 72.0 in | Wt 262.0 lb

## 2023-05-06 DIAGNOSIS — D649 Anemia, unspecified: Secondary | ICD-10-CM | POA: Insufficient documentation

## 2023-05-06 DIAGNOSIS — E1165 Type 2 diabetes mellitus with hyperglycemia: Secondary | ICD-10-CM | POA: Diagnosis not present

## 2023-05-06 DIAGNOSIS — Z01818 Encounter for other preprocedural examination: Secondary | ICD-10-CM | POA: Diagnosis present

## 2023-05-06 DIAGNOSIS — Z794 Long term (current) use of insulin: Secondary | ICD-10-CM | POA: Insufficient documentation

## 2023-05-06 LAB — CBC WITH DIFFERENTIAL/PLATELET
Abs Immature Granulocytes: 0.02 10*3/uL (ref 0.00–0.07)
Basophils Absolute: 0 10*3/uL (ref 0.0–0.1)
Basophils Relative: 1 %
Eosinophils Absolute: 0.1 10*3/uL (ref 0.0–0.5)
Eosinophils Relative: 2 %
HCT: 38 % — ABNORMAL LOW (ref 39.0–52.0)
Hemoglobin: 11.5 g/dL — ABNORMAL LOW (ref 13.0–17.0)
Immature Granulocytes: 0 %
Lymphocytes Relative: 32 %
Lymphs Abs: 1.8 10*3/uL (ref 0.7–4.0)
MCH: 22.5 pg — ABNORMAL LOW (ref 26.0–34.0)
MCHC: 30.3 g/dL (ref 30.0–36.0)
MCV: 74.2 fL — ABNORMAL LOW (ref 80.0–100.0)
Monocytes Absolute: 0.4 10*3/uL (ref 0.1–1.0)
Monocytes Relative: 6 %
Neutro Abs: 3.3 10*3/uL (ref 1.7–7.7)
Neutrophils Relative %: 59 %
Platelets: 324 10*3/uL (ref 150–400)
RBC: 5.12 MIL/uL (ref 4.22–5.81)
RDW: 20.7 % — ABNORMAL HIGH (ref 11.5–15.5)
WBC: 5.7 10*3/uL (ref 4.0–10.5)
nRBC: 0 % (ref 0.0–0.2)

## 2023-05-06 LAB — BASIC METABOLIC PANEL
Anion gap: 11 (ref 5–15)
BUN: 15 mg/dL (ref 6–20)
CO2: 22 mmol/L (ref 22–32)
Calcium: 8.8 mg/dL — ABNORMAL LOW (ref 8.9–10.3)
Chloride: 103 mmol/L (ref 98–111)
Creatinine, Ser: 0.67 mg/dL (ref 0.61–1.24)
GFR, Estimated: >60 mL/min (ref >60)
Glucose, Bld: 341 mg/dL — ABNORMAL HIGH (ref 70–99)
Potassium: 3.6 mmol/L (ref 3.5–5.1)
Sodium: 136 mmol/L (ref 135–145)

## 2023-05-06 NOTE — Progress Notes (Signed)
Patient glucose level today was 341, notified Dr.Patterson. Per Dr.Patterson patient should be cancelled unless the procedure is for urgent reasons and Patient's diabetes needs to be under better control before having anesthesia. A1c 02/2023 was 10. IB message sent to Dr.Rourk and Mindy Estudilo.

## 2023-05-09 ENCOUNTER — Telehealth: Payer: Self-pay | Admitting: *Deleted

## 2023-05-09 NOTE — Telephone Encounter (Signed)
-----   Message from Nurse Derrek Monaco sent at 05/06/2023  1:24 PM EST ----- Dr.Rourk,   Patient's Glucose level today at PAT visit was 341. I notified Dr.Patterson (anesthesiologist). Per Dr.Patterson patient's colonoscopy for 05/11/23 should be cancelled unless it's an emergency until his diabetes is under better control. Hemoglobin A1C 10.9 02/25/2024. Thank you, Robbin Myers,RN

## 2023-05-09 NOTE — Telephone Encounter (Signed)
Pt was informed that needed to cancel his procedure due to his BS being high and per Dr.Patterson (anesthesiologist) , pt needs to get diabetes under better control. Pt states that is a "good" glucose reading for him. He states he doesn't want to cancel because he has made all the arrangements for this procedure. Informed pt that anesthesiologist will not put him under with high BS reading. Pt says just go ahead and cancel the procedure. Message sent to endo to cancel his procedure.

## 2023-05-09 NOTE — Telephone Encounter (Signed)
Called pt, LMOVM to call back. 

## 2023-05-11 ENCOUNTER — Encounter (HOSPITAL_COMMUNITY): Admission: RE | Payer: Self-pay | Source: Home / Self Care

## 2023-05-11 ENCOUNTER — Ambulatory Visit (HOSPITAL_COMMUNITY): Admission: RE | Admit: 2023-05-11 | Payer: 59 | Source: Home / Self Care | Admitting: Internal Medicine

## 2023-05-11 SURGERY — COLONOSCOPY WITH PROPOFOL
Anesthesia: Choice

## 2023-06-11 ENCOUNTER — Telehealth: Payer: Self-pay | Admitting: Internal Medicine

## 2023-06-11 NOTE — Telephone Encounter (Signed)
 Labs reviewed fro Dr. Sherwood Gambler from 3/14; pt has iron deficiency.  Need to follow-through on gi evaluation asap

## 2023-06-13 NOTE — Telephone Encounter (Signed)
 Spoke with pt. Previous cancelled bc of his blood sugars being too high and not controlled. He stated now he is mounjaro on Wednesdays and lowest runs 146 and the highest he runs now is 240. Dr. Jena Gauss, ok to proceed?

## 2023-06-13 NOTE — Telephone Encounter (Signed)
 Per Dr. Jena Gauss ok to schedule with another provider since he will be out starting 3/31. Spoke with Dr. Marletta Lor and he is fine to do procedure for patient.   Called pt, no answer and VM full

## 2023-06-14 MED ORDER — PEG 3350-KCL-NA BICARB-NACL 420 G PO SOLR: 4000.0000 mL | Freq: Once | ORAL | 0 refills | Status: AC

## 2023-06-14 NOTE — Telephone Encounter (Signed)
 PA approved via [facility] CPT Code GECOL Description: Colonoscopy Authorization Number: [id number] Case Number: [id number] Review Date: 06/14/2023 4:27:12 PM Expiration Date: 09/12/2023 Status: Your case has been Approved.

## 2023-06-14 NOTE — Addendum Note (Signed)
 Addended by: Armstead Peaks on: 06/14/2023 04:33 PM   Modules accepted: Orders

## 2023-06-14 NOTE — Telephone Encounter (Signed)
 Spoke with pt. He was agreeable to Dr. Marletta Lor performing procedure. Scheduled for 4/3. Aware will send instructions and rx for prep to pharmacy. Discussed medications and when to stop them.

## 2023-06-14 NOTE — Telephone Encounter (Signed)
Called pt again, no answer and VM full.

## 2023-06-23 ENCOUNTER — Other Ambulatory Visit: Payer: Self-pay

## 2023-06-23 ENCOUNTER — Ambulatory Visit (HOSPITAL_COMMUNITY)
Admission: RE | Admit: 2023-06-23 | Discharge: 2023-06-23 | Disposition: A | Discharge status: Home / Self Care | Attending: Internal Medicine | Admitting: Internal Medicine

## 2023-06-23 ENCOUNTER — Ambulatory Visit (HOSPITAL_BASED_OUTPATIENT_CLINIC_OR_DEPARTMENT_OTHER)

## 2023-06-23 ENCOUNTER — Encounter (HOSPITAL_COMMUNITY)
Admission: RE | Disposition: A | Discharge status: Home / Self Care | Payer: Self-pay | Source: Home / Self Care | Attending: Internal Medicine

## 2023-06-23 ENCOUNTER — Encounter (HOSPITAL_COMMUNITY): Payer: Self-pay | Admitting: Internal Medicine

## 2023-06-23 ENCOUNTER — Ambulatory Visit (HOSPITAL_COMMUNITY)

## 2023-06-23 DIAGNOSIS — K648 Other hemorrhoids: Secondary | ICD-10-CM | POA: Insufficient documentation

## 2023-06-23 DIAGNOSIS — E119 Type 2 diabetes mellitus without complications: Secondary | ICD-10-CM | POA: Diagnosis not present

## 2023-06-23 DIAGNOSIS — Z833 Family history of diabetes mellitus: Secondary | ICD-10-CM | POA: Diagnosis not present

## 2023-06-23 DIAGNOSIS — I10 Essential (primary) hypertension: Secondary | ICD-10-CM | POA: Insufficient documentation

## 2023-06-23 DIAGNOSIS — Z79899 Other long term (current) drug therapy: Secondary | ICD-10-CM | POA: Insufficient documentation

## 2023-06-23 DIAGNOSIS — Z7985 Long-term (current) use of injectable non-insulin antidiabetic drugs: Secondary | ICD-10-CM | POA: Diagnosis not present

## 2023-06-23 DIAGNOSIS — Q438 Other specified congenital malformations of intestine: Secondary | ICD-10-CM | POA: Diagnosis not present

## 2023-06-23 DIAGNOSIS — D509 Iron deficiency anemia, unspecified: Secondary | ICD-10-CM | POA: Insufficient documentation

## 2023-06-23 DIAGNOSIS — Z7984 Long term (current) use of oral hypoglycemic drugs: Secondary | ICD-10-CM | POA: Diagnosis not present

## 2023-06-23 DIAGNOSIS — D123 Benign neoplasm of transverse colon: Secondary | ICD-10-CM

## 2023-06-23 DIAGNOSIS — Z794 Long term (current) use of insulin: Secondary | ICD-10-CM | POA: Diagnosis not present

## 2023-06-23 DIAGNOSIS — K219 Gastro-esophageal reflux disease without esophagitis: Secondary | ICD-10-CM | POA: Insufficient documentation

## 2023-06-23 HISTORY — PX: COLONOSCOPY: SHX5424

## 2023-06-23 HISTORY — PX: POLYPECTOMY: SHX5525

## 2023-06-23 HISTORY — DX: Sleep apnea, unspecified: G47.30

## 2023-06-23 LAB — GLUCOSE, CAPILLARY: Glucose-Capillary: 208 mg/dL — ABNORMAL HIGH (ref 70–99)

## 2023-06-23 SURGERY — COLONOSCOPY
Anesthesia: General

## 2023-06-23 MED ORDER — LIDOCAINE HCL (PF) 2 % IJ SOLN
INTRAMUSCULAR | Status: DC | PRN
  Administered 2023-06-23: 50 mg via INTRADERMAL

## 2023-06-23 MED ORDER — SIMETHICONE 40 MG/0.6ML PO SUSP
ORAL | Status: DC | PRN
  Administered 2023-06-23: 120 mL

## 2023-06-23 MED ORDER — PROPOFOL 10 MG/ML IV BOLUS
INTRAVENOUS | Status: DC | PRN
  Administered 2023-06-23: 150 ug/kg/min via INTRAVENOUS
  Administered 2023-06-23: 100 mg via INTRAVENOUS

## 2023-06-23 MED ORDER — LACTATED RINGERS IV SOLN: INTRAVENOUS | Status: DC | PRN

## 2023-06-23 NOTE — Anesthesia Preprocedure Evaluation (Signed)
 Anesthesia Evaluation  Patient identified by MRN, date of birth, ID band Patient awake    Reviewed: Allergy & Precautions, H&P , NPO status , Patient's Chart, lab work & pertinent test results, reviewed documented beta blocker date and time   Airway Mallampati: II  TM Distance: >3 FB Neck ROM: full    Dental no notable dental hx. (+) Dental Advisory Given, Teeth Intact   Pulmonary neg pulmonary ROS   Pulmonary exam normal breath sounds clear to auscultation       Cardiovascular Exercise Tolerance: Good hypertension, Normal cardiovascular exam Rhythm:regular Rate:Normal     Neuro/Psych negative neurological ROS  negative psych ROS   GI/Hepatic Neg liver ROS,GERD  ,,  Endo/Other  diabetes, Poorly Controlled, Type 2    Renal/GU negative Renal ROS  negative genitourinary   Musculoskeletal   Abdominal   Peds  Hematology   Anesthesia Other Findings   Reproductive/Obstetrics negative OB ROS                             Anesthesia Physical Anesthesia Plan  ASA: 3 and emergent  Anesthesia Plan: General   Post-op Pain Management: Minimal or no pain anticipated   Induction: Intravenous  PONV Risk Score and Plan: Propofol infusion  Airway Management Planned: Nasal Cannula and Natural Airway  Additional Equipment: None  Intra-op Plan:   Post-operative Plan:   Informed Consent: I have reviewed the patients History and Physical, chart, labs and discussed the procedure including the risks, benefits and alternatives for the proposed anesthesia with the patient or authorized representative who has indicated his/her understanding and acceptance.     Dental Advisory Given  Plan Discussed with: CRNA  Anesthesia Plan Comments:        Anesthesia Quick Evaluation

## 2023-06-23 NOTE — Anesthesia Postprocedure Evaluation (Signed)
 Anesthesia Post Note  Patient: Kevin Strong  Procedure(s) Performed: COLONOSCOPY POLYPECTOMY  Patient location during evaluation: PACU Anesthesia Type: General Level of consciousness: awake and alert Pain management: pain level controlled Vital Signs Assessment: post-procedure vital signs reviewed and stable Respiratory status: spontaneous breathing, nonlabored ventilation, respiratory function stable and patient connected to nasal cannula oxygen Cardiovascular status: blood pressure returned to baseline and stable Postop Assessment: no apparent nausea or vomiting Anesthetic complications: no   There were no known notable events for this encounter.   Last Vitals:  Vitals:   06/23/23 0852 06/23/23 1000  BP: (!) 151/84 (!) 114/49  Pulse: 63 68  Resp: 18 20  Temp: 36.9 C 36.5 C  SpO2: 99% 97%    Last Pain:  Vitals:   06/23/23 1000  TempSrc: Oral  PainSc: 0-No pain                 Alyxandria Wentz L Bennette Hasty

## 2023-06-23 NOTE — Transfer of Care (Signed)
 Immediate Anesthesia Transfer of Care Note  Patient: Kevin Strong  Procedure(s) Performed: COLONOSCOPY POLYPECTOMY  Patient Location: Endoscopy Unit  Anesthesia Type:General  Level of Consciousness: awake  Airway & Oxygen Therapy: Patient Spontanous Breathing  Post-op Assessment: Report given to RN and Post -op Vital signs reviewed and stable  Post vital signs: Reviewed and stable  Last Vitals:  Vitals Value Taken Time  BP    Temp    Pulse    Resp    SpO2      Last Pain:  Vitals:   06/23/23 0932  TempSrc:   PainSc: 0-No pain      Patients Stated Pain Goal: 10 (06/23/23 0842)  Complications: No notable events documented.

## 2023-06-23 NOTE — Anesthesia Procedure Notes (Addendum)
 Date/Time: 06/23/2023 9:34 AM  Performed by: Julian Reil, CRNAPre-anesthesia Checklist: Patient identified, Emergency Drugs available, Suction available, Patient being monitored and Timeout performed Patient Re-evaluated:Patient Re-evaluated prior to induction Oxygen Delivery Method: Simple face mask Induction Type: IV induction Placement Confirmation: positive ETCO2

## 2023-06-23 NOTE — Op Note (Signed)
 [facility] Patient Name: Kevin Strong Procedure Date: 06/23/2023 9:21 AM MRN: [id number] Date of Birth: Sep 18, 1965 Attending MD: Hennie Duos. Maple Mirza, [id number] CSN: [id number] Age: 58 Admit Type: Outpatient Procedure:                Colonoscopy Indications:              Iron deficiency anemia Providers:                Hennie Duos. Marletta Lor, DO, Sheran Fava, Dyann Ruddle Referring MD:              Medicines:                See the Anesthesia note for documentation of the                            administered medications Complications:            No immediate complications. Estimated Blood Loss:     Estimated blood loss was minimal. Procedure:                Pre-Anesthesia Assessment:                           - The anesthesia plan was to use monitored                            anesthesia care (MAC).                           After obtaining informed consent, the colonoscope                            was passed under direct vision. Throughout the                            procedure, the patient's blood pressure, pulse, and                            oxygen saturations were monitored continuously. The                            [id number]-[id number] ([id number]) was introduced through the                            anus and advanced to the the terminal ileum, with                            identification of the appendiceal orifice and IC                            valve. The colonoscopy was somewhat difficult due                            to a redundant colon and significant looping.  Successful completion of the procedure was aided by                            applying abdominal pressure. The patient tolerated                            the procedure well. The quality of the bowel                            preparation was evaluated using the BBPS [facility]                            Bowel Preparation Scale) with scores of:  Right                            Colon = 3, Transverse Colon = 3 and Left Colon = 3                            (entire mucosa seen well with no residual staining,                            small fragments of stool or opaque liquid). The                            total BBPS score equals 9. Scope In: 9:36:07 AM Scope Out: 9:57:15 AM Scope Withdrawal Time: 0 hours 13 minutes 44 seconds  Total Procedure Duration: 0 hours 21 minutes 8 seconds  Findings:      Non-bleeding internal hemorrhoids were found during endoscopy.      Two sessile polyps were found in the transverse colon. The polyps were 4       to 5 mm in size. These polyps were removed with a cold snare. Resection       and retrieval were complete.      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       Biopsies were taken with a cold forceps for histology. Impression:               - Non-bleeding internal hemorrhoids.                           - Two 4 to 5 mm polyps in the transverse colon,                            removed with a cold snare. Resected and retrieved. Moderate Sedation:      Per Anesthesia Care Recommendation:           - Patient has a contact number available for                            emergencies. The signs and symptoms of potential                            delayed complications were discussed with the  patient. Return to normal activities tomorrow.                            Written discharge instructions were provided to the                            patient.                           - Resume previous diet.                           - Continue present medications.                           - Await pathology results.                           - Repeat colonoscopy in 7-10 years for surveillance.                           - Return to GI clinic in 4 weeks. Consider capsule                            endoscopy to complete GI work up. Procedure Code(s):         --- Professional ---                           [phone], Colonoscopy, flexible; with removal of                            tumor(s), polyp(s), or other lesion(s) by snare                            technique                           45380, 59, Colonoscopy, flexible; with biopsy,                            single or multiple Diagnosis Code(s):        --- Professional ---                           D12.3, Benign neoplasm of transverse colon (hepatic                            flexure or splenic flexure)                           K64.8, Other hemorrhoids                           D50.9, Iron deficiency anemia, unspecified CPT copyright 2022 American Medical Association. All rights reserved. The codes documented in this report are preliminary and upon coder review may  be revised to meet current compliance requirements. Hennie Duos. Marletta Lor, DO  Hennie Duos. Kevin Vanwinkle, DO 06/23/2023 10:02:11 AM This report has been signed electronically. Number of Addenda: 0

## 2023-06-23 NOTE — Discharge Instructions (Addendum)
  Colonoscopy Discharge Instructions  Read the instructions outlined below and refer to this sheet in the next few weeks. These discharge instructions provide you with general information on caring for yourself after you leave the hospital. Your doctor may also give you specific instructions. While your treatment has been planned according to the most current medical practices available, unavoidable complications occasionally occur.   ACTIVITY You may resume your regular activity, but move at a slower pace for the next 24 hours.  Take frequent rest periods for the next 24 hours.  Walking will help get rid of the air and reduce the bloated feeling in your belly (abdomen).  No driving for 24 hours (because of the medicine (anesthesia) used during the test).   Do not sign any important legal documents or operate any machinery for 24 hours (because of the anesthesia used during the test).  NUTRITION Drink plenty of fluids.  You may resume your normal diet as instructed by your doctor.  Begin with a light meal and progress to your normal diet. Heavy or fried foods are harder to digest and may make you feel sick to your stomach (nauseated).  Avoid alcoholic beverages for 24 hours or as instructed.  MEDICATIONS You may resume your normal medications unless your doctor tells you otherwise.  WHAT YOU CAN EXPECT TODAY Some feelings of bloating in the abdomen.  Passage of more gas than usual.  Spotting of blood in your stool or on the toilet paper.  IF YOU HAD POLYPS REMOVED DURING THE COLONOSCOPY: No aspirin products for 7 days or as instructed.  No alcohol for 7 days or as instructed.  Eat a soft diet for the next 24 hours.  FINDING OUT THE RESULTS OF YOUR TEST Not all test results are available during your visit. If your test results are not back during the visit, make an appointment with your caregiver to find out the results. Do not assume everything is normal if you have not heard from your  caregiver or the medical facility. It is important for you to follow up on all of your test results.  SEEK IMMEDIATE MEDICAL ATTENTION IF: You have more than a spotting of blood in your stool.  Your belly is swollen (abdominal distention).  You are nauseated or vomiting.  You have a temperature over 101.  You have abdominal pain or discomfort that is severe or gets worse throughout the day.   Your colonoscopy revealed 2 polyp(s) which I removed successfully. Await pathology results, my office will contact you. I recommend repeating colonoscopy in 7-10 years for surveillance purposes depending on pathology results.   The end portion of your small bowel called the terminal ileum did appear somewhat polypoid.  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  I did take samples however.  We will call with these results as well.  Follow-up in GI office in 4 to 6 weeks. Office will call with appointment.   I hope you have a great rest of your week!  Hennie Duos. Marletta Lor, D.O. Gastroenterology and Hepatology [facility]

## 2023-06-23 NOTE — H&P (Signed)
 Primary Care Physician:  Elfredia Nevins, MD Primary Gastroenterologist:  Dr. Marletta Lor  Pre-Procedure History & Physical: HPI:  Kevin Strong is a 58 y.o. male is here for a colonoscopy to be performed for iron deficiency anemia  Past Medical History:  Diagnosis Date   Diabetes mellitus, type II (HCC)    Hyperlipidemia    Hypertension    Sleep apnea     Past Surgical History:  Procedure Laterality Date   AMPUTATION TOE Right 02/28/2023   Procedure: AMPUTATION TOE; right 2nd toe;  Surgeon: [name], MD;  Location: [facility];  Service: General;  Laterality: Right;   BIOPSY  02/25/2023   Procedure: BIOPSY;  Surgeon: Corbin Ade, MD;  Location: [facility];  Service: Endoscopy;;   ELBOW SURGERY     ESOPHAGOGASTRODUODENOSCOPY (EGD) WITH PROPOFOL N/A 02/25/2023   Procedure: ESOPHAGOGASTRODUODENOSCOPY (EGD) WITH PROPOFOL;  Surgeon: Corbin Ade, MD;  Location: [facility];  Service: Endoscopy;  Laterality: N/A;   KNEE SURGERY Bilateral     Prior to Admission medications   Medication Sig Start Date End Date Taking? Authorizing Provider  acetaminophen (TYLENOL) 500 MG tablet Take 1,500 mg by mouth every 6 (six) hours as needed for moderate pain (pain score 4-6).   Yes [provider]  polyethylene glycol (MIRALAX / GLYCOLAX) 17 g packet Take 17 g by mouth daily. 03/02/23  Yes Vassie Loll, MD  tirzepatide [facility]) 2.5 MG/0.5ML Pen Inject 2.5 mg into the skin once a week.   Yes [provider]  atorvastatin (LIPITOR) 40 MG tablet Take 40 mg by mouth daily. 10/02/21   [provider]  Blood Glucose Monitoring Suppl (ACCU-CHEK GUIDE ME) w/Device KIT 1 Piece by Does not apply route as directed. 11/03/21   Roma Kayser, MD  Continuous Glucose Sensor (DEXCOM G7 SENSOR) MISC Use as instructed to check blood sugar and exchange every 7 days. 03/01/23   Vassie Loll, MD  cyanocobalamin 1000 MCG tablet Take 1 tablet (1,000 mcg total) by mouth 2  (two) times daily. 03/01/23   Vassie Loll, MD  gabapentin (NEURONTIN) 600 MG tablet Take 600 mg by mouth daily. 07/03/21   [provider]  glipiZIDE (GLUCOTROL XL) 5 MG 24 hr tablet Take 1 tablet (5 mg total) by mouth daily with breakfast. 11/03/21   Nida, Denman George, MD  glucose blood (ACCU-CHEK GUIDE) test strip Use as instructed 11/03/21   Roma Kayser, MD  Insulin Glargine-Lixisenatide 100-33 UNT-MCG/ML SOPN Inject 60 Units into the skin at bedtime.    [provider]  iron polysaccharides (NIFEREX) 150 MG capsule Take 1 capsule (150 mg total) by mouth daily. 03/02/23   Vassie Loll, MD  lisinopril (ZESTRIL) 2.5 MG tablet Take 2.5 mg by mouth daily. 10/27/21   [provider]  pantoprazole (PROTONIX) 40 MG tablet Take 1 tablet (40 mg total) by mouth 2 (two) times daily. 03/01/23 02/29/24  Vassie Loll, MD  traMADol (ULTRAM) 50 MG tablet Take 1 tablet (50 mg total) by mouth every 6 (six) hours as needed for severe pain (pain score 7-10). 04/06/23   [name], MD    Allergies as of 06/14/2023 - Review Complete 05/06/2023  Allergen Reaction Noted   Oxycodone Hives 03/21/2023    Family History  Problem Relation Age of Onset   Hypertension Mother    Thyroid disease Mother    Diabetes Mother    Hypertension Father    Hyperlipidemia Father     Social History  Socioeconomic History   Marital status: Married    Spouse name: Not on file   Number of children: Not on file   Years of education: Not on file   Highest education level: Not on file  Occupational History   Not on file  Tobacco Use   Smoking status: Never   Smokeless tobacco: Never  Vaping Use   Vaping status: Never Used  Substance and Sexual Activity   Alcohol use: Never   Drug use: Never   Sexual activity: Not on file  Other Topics Concern   Not on file  Social History Narrative   Not on file   Social Drivers of Health   Financial Resource Strain: Not on file   Food Insecurity: No Food Insecurity (02/24/2023)   Hunger Vital Sign    Worried About Running Out of Food in the Last Year: Never true    Ran Out of Food in the Last Year: Never true  Transportation Needs: No Transportation Needs (02/24/2023)   PRAPARE - Administrator, Civil Service (Medical): No    Lack of Transportation (Non-Medical): No  Physical Activity: Not on file  Stress: No Stress Concern Present (01/08/2023)   Received from [facility] of Occupational Health - Occupational Stress Questionnaire    Feeling of Stress : Only a little  Social Connections: Unknown (01/08/2023)   Received from [facility]   Social Network    Social Network: Not on file  Intimate Partner Violence: Not At Risk (02/24/2023)   Humiliation, Afraid, Rape, and Kick questionnaire    Fear of Current or Ex-Partner: No    Emotionally Abused: No    Physically Abused: No    Sexually Abused: No    Review of Systems: See HPI, otherwise negative ROS  Physical Exam: Vital signs in last 24 hours: Temp:  [98.5 F (36.9 C)] 98.5 F (36.9 C) (04/03 0852) Pulse Rate:  [63] 63 (04/03 0852) Resp:  [18] 18 (04/03 0852) BP: (151)/(84) 151/84 (04/03 0852) SpO2:  [99 %] 99 % (04/03 0852) Weight:  [116.3 kg] 116.3 kg (04/03 0842)   General:   Alert,  Well-developed, well-nourished, pleasant and cooperative in NAD Head:  Normocephalic and atraumatic. Eyes:  Sclera clear, no icterus.   Conjunctiva pink. Ears:  Normal auditory acuity. Nose:  No deformity, discharge,  or lesions. Msk:  Symmetrical without gross deformities. Normal posture. Extremities:  Without clubbing or edema. Neurologic:  Alert and  oriented x4;  grossly normal neurologically. Skin:  Intact without significant lesions or rashes. Psych:  Alert and cooperative. Normal mood and affect.  Impression/Plan: Kevin Strong is here for a colonoscopy to be performed for iron deficiency anemia  The risks of the  procedure including infection, bleed, or perforation as well as benefits, limitations, alternatives and imponderables have been reviewed with the patient. Questions have been answered. All parties agreeable.

## 2023-06-24 ENCOUNTER — Encounter (HOSPITAL_COMMUNITY): Payer: Self-pay | Admitting: Internal Medicine

## 2023-06-24 LAB — SURGICAL PATHOLOGY

## 2023-07-28 ENCOUNTER — Ambulatory Visit: Admitting: Gastroenterology

## 2023-11-09 ENCOUNTER — Other Ambulatory Visit (HOSPITAL_COMMUNITY): Payer: Self-pay

## 2023-11-09 MED ORDER — MOUNJARO 5 MG/0.5ML ~~LOC~~ SOAJ
5.0000 mg | SUBCUTANEOUS | 3 refills | Status: AC
  Filled 2023-11-09: qty 2, 28d supply, fill #0

## 2023-11-09 MED ORDER — INSULIN ASPART 100 UNIT/ML IJ SOLN
2.0000 [IU] | Freq: Three times a day (TID) | INTRAMUSCULAR | 3 refills | Status: DC
  Filled 2023-11-09: qty 10, 34d supply, fill #0

## 2023-11-09 MED ORDER — LISINOPRIL 2.5 MG PO TABS
2.5000 mg | ORAL_TABLET | Freq: Every day | ORAL | 1 refills | Status: AC
  Filled 2023-11-09: qty 30, 30d supply, fill #0

## 2023-11-09 MED ORDER — FREESTYLE LIBRE 3 PLUS SENSOR MISC
11 refills | Status: AC
  Filled 2023-11-09: qty 2, 30d supply, fill #0

## 2023-11-09 MED ORDER — ATORVASTATIN CALCIUM 40 MG PO TABS
40.0000 mg | ORAL_TABLET | Freq: Every day | ORAL | 1 refills | Status: AC
  Filled 2023-11-09: qty 90, 90d supply, fill #0

## 2023-11-10 ENCOUNTER — Encounter: Payer: Self-pay | Admitting: Podiatry

## 2023-11-10 ENCOUNTER — Other Ambulatory Visit (HOSPITAL_COMMUNITY): Payer: Self-pay

## 2023-11-10 ENCOUNTER — Ambulatory Visit (INDEPENDENT_AMBULATORY_CARE_PROVIDER_SITE_OTHER): Admitting: Podiatry

## 2023-11-10 DIAGNOSIS — L600 Ingrowing nail: Secondary | ICD-10-CM

## 2023-11-10 MED ORDER — CEPHALEXIN 500 MG PO CAPS
500.0000 mg | ORAL_CAPSULE | Freq: Three times a day (TID) | ORAL | 0 refills | Status: AC
  Filled 2023-11-10: qty 21, 7d supply, fill #0

## 2023-11-10 NOTE — Patient Instructions (Signed)
 Soak Instructions- HAVE SOMEONE ELSE CHECK THE TEMPERATURE OF THE WATER  TO MAKE SURE IT IS NOT TOO HOT OR COLD!    THE DAY AFTER THE PROCEDURE  Place 1/4 cup of epsom salts in a quart of warm tap water .  Submerge your foot or feet with outer bandage intact for the initial soak; this will allow the bandage to become moist and wet for easy lift off.  Once you remove your bandage, continue to soak in the solution for 20 minutes.  This soak should be done twice a day.  Next, remove your foot or feet from solution, blot dry the affected area and cover.  You may use a band aid large enough to cover the area or use gauze and tape.  Apply other medications to the area as directed by the doctor such as polysporin neosporin.  IF YOUR SKIN BECOMES IRRITATED WHILE USING THESE INSTRUCTIONS, IT IS OKAY TO SWITCH TO  WHITE VINEGAR AND WATER . Or you may use antibacterial soap and water  to keep the toe clean  Monitor for any signs/symptoms of infection. Call the office immediately if any occur or go directly to the emergency room. Call with any questions/concerns.

## 2023-11-10 NOTE — Progress Notes (Signed)
 Subjective:   Patient ID: Kevin Strong, male   DOB: 58 y.o.   MRN: [id number]   HPI Chief Complaint  Patient presents with   Nail Problem    Nail discolored on right great toenail. 0 pain and infection. Pt. Wants avulsion. IDDM A1C 10.9 Pt. Concerned with and wanting removal to avoid a possible amputation.   58 year old male with history of right second toe partial potation given infection presents the office today with concerns of trauma to his right big toenail.  The nail is loose and has been some discoloration and needs to have the nail removed.  He does not have any pain although he does have neuropathy.  He feels that the nails going to get infected like he did on the second toe which is now resulted in amputation.   Review of Systems  All other systems reviewed and are negative.  Past Medical History:  Diagnosis Date   Diabetes mellitus, type II (HCC)    Hyperlipidemia    Hypertension    Sleep apnea     Past Surgical History:  Procedure Laterality Date   AMPUTATION TOE Right 02/28/2023   Procedure: AMPUTATION TOE; right 2nd toe;  Surgeon: Kallie Manuelita BROCKS, MD;  Location: [facility];  Service: General;  Laterality: Right;   BIOPSY  02/25/2023   Procedure: BIOPSY;  Surgeon: Shaaron Lamar HERO, MD;  [facility]: [facility];  Service: Endoscopy;;   COLONOSCOPY N/A 06/23/2023   Procedure: COLONOSCOPY;  Surgeon: Cindie Carlin POUR, DO;  Location: [facility];  Service: Endoscopy;  Laterality: N/A;  945AM, [place] RM 1/2   ELBOW SURGERY     ESOPHAGOGASTRODUODENOSCOPY (EGD) WITH PROPOFOL  N/A 02/25/2023   Procedure: ESOPHAGOGASTRODUODENOSCOPY (EGD) WITH PROPOFOL ;  Surgeon: Shaaron Lamar HERO, MD;  Location: [facility];  Service: Endoscopy;  Laterality: N/A;   KNEE SURGERY Bilateral    POLYPECTOMY  06/23/2023   Procedure: POLYPECTOMY;  Surgeon: Cindie Carlin POUR, DO;  Location: [facility];  Service: Endoscopy;;     Current Outpatient Medications:    acetaminophen  (TYLENOL ) 500 MG  tablet, Take 1,500 mg by mouth every 6 (six) hours as needed for moderate pain (pain score 4-6)., Disp: , Rfl:    atorvastatin  (LIPITOR) 40 MG tablet, Take 40 mg by mouth daily., Disp: , Rfl:    atorvastatin  (LIPITOR) 40 MG tablet, Take 1 tablet (40 mg total) by mouth daily., Disp: 90 tablet, Rfl: 1   Blood Glucose Monitoring Suppl (ACCU-CHEK GUIDE ME) w/Device KIT, 1 Piece by Does not apply route as directed., Disp: 1 kit, Rfl: 0   cephALEXin  (KEFLEX ) 500 MG capsule, Take 1 capsule (500 mg total) by mouth 3 (three) times daily., Disp: 21 capsule, Rfl: 0   Continuous Glucose Sensor (DEXCOM G7 SENSOR) MISC, Use as instructed to check blood sugar and exchange every 7 days., Disp: 12 each, Rfl: 0   Continuous Glucose Sensor (FREESTYLE LIBRE 3 PLUS SENSOR) MISC, Use one sensor every 15 days., Disp: 2 each, Rfl: 11   cyanocobalamin  1000 MCG tablet, Take 1 tablet (1,000 mcg total) by mouth 2 (two) times daily., Disp: 60 tablet, Rfl: 2   gabapentin  (NEURONTIN ) 600 MG tablet, Take 600 mg by mouth daily., Disp: , Rfl:    glipiZIDE  (GLUCOTROL  XL) 5 MG 24 hr tablet, Take 1 tablet (5 mg total) by mouth daily with breakfast., Disp: 90 tablet, Rfl: 1   glucose blood (ACCU-CHEK GUIDE) test strip, Use as instructed, Disp: 150 each, Rfl: 2   insulin  aspart (  NOVOLOG ) 100 UNIT/ML injection, Inject 2-10 Units into the skin 3 (three) times daily., Disp: 10 mL, Rfl: 3   Insulin  Glargine-Lixisenatide  100-33 UNT-MCG/ML SOPN, Inject 60 Units into the skin at bedtime., Disp: , Rfl:    iron  polysaccharides (NIFEREX) 150 MG capsule, Take 1 capsule (150 mg total) by mouth daily., Disp: 30 capsule, Rfl: 1   lisinopril  (ZESTRIL ) 2.5 MG tablet, Take 2.5 mg by mouth daily., Disp: , Rfl:    lisinopril  (ZESTRIL ) 2.5 MG tablet, Take 1 tablet (2.5 mg total) by mouth daily., Disp: 90 tablet, Rfl: 1   pantoprazole  (PROTONIX ) 40 MG tablet, Take 1 tablet (40 mg total) by mouth 2 (two) times daily., Disp: 60 tablet, Rfl: 3   polyethylene  glycol (MIRALAX  / GLYCOLAX ) 17 g packet, Take 17 g by mouth daily., Disp: 28 each, Rfl: 0   tirzepatide  (MOUNJARO ) 2.5 MG/0.5ML Pen, Inject 2.5 mg into the skin once a week., Disp: , Rfl:    tirzepatide  (MOUNJARO ) 5 MG/0.5ML Pen, Inject 5 mg into the skin once a week., Disp: 2 mL, Rfl: 3   traMADol  (ULTRAM ) 50 MG tablet, Take 1 tablet (50 mg total) by mouth every 6 (six) hours as needed for severe pain (pain score 7-10). (Patient not taking: Reported on 11/10/2023), Disp: 15 tablet, Rfl: 0  Allergies  Allergen Reactions   Oxycodone  Hives          Objective:  Physical Exam  General: AAO x3, NAD  Dermatological: Right hallux toenail is loose but only attached on the proximal portion of the nail.  Mild incurvation of nail borders.  There is dried blood present under the entire toenail mostly.  There is some localized edema on the base of the toenail but is no erythema or warmth.  No drainage or pus or signs of infection otherwise.  No open lesions.  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  Vascular: Dorsalis Pedis artery and Posterior Tibial artery pedal pulses are 2/4 bilateral with immedate capillary fill time. Pedal hair growth present. There is no pain with calf compression, swelling, warmth, erythema.   Neruologic: Sensation decreased  Musculoskeletal: No significant pain although he does have neuropathy.     Assessment:   58 year old male right hallux ingrown toenail, onychodystrophy     Plan:  -Treatment options discussed including all alternatives, risks, and complications -Etiology of symptoms were discussed - Patient was having nail removed.  Discussed pros and cons of this.  Given his increased A1c he is at higher risk of of complications which could include infection, delayed healing or even amputation of the toe.  He states he is well aware of this.  Given the trauma to the toenail think is reasonable to remove the toenail.  See procedure note below. -At this time,  patient was to proceed with total nail removal without chemical matricectomy.Risks and complications were discussed with the patient for which they understand and  verbally consent to the procedure. Under sterile conditions a total of 3 mL of a mixture of 2% lidocaine  plain and 0.5% Marcaine  plain was infiltrated in a hallux block fashion. Once anesthetized, the skin was prepped in sterile fashion. A tourniquet was then applied. Next the nail was removed in total nature remove all nail borders.  Once the nail was removed, the area was debrided and the underlying skin was intact.  There appeared to be good bleeding of the nailbed and appeared to be healthy.  No purulence.  The area was irrigated and hemostasis was obtained.  A dry sterile dressing was applied. After application of the dressing the tourniquet was removed and there is found to be an immediate capillary refill time to the digit. The patient tolerated the procedure well any complications. Post procedure instructions were discussed the patient for which he verbally understood. Follow-up in one week for nail check or sooner if any problems are to arise. Discussed signs/symptoms of worsening infection and directed to call the office immediately should any occur or go directly to the emergency room. In the meantime, encouraged to call the office with any questions, concerns, changes symptoms. - Keflex  -Monitor for any clinical signs or symptoms of infection and directed to call the office immediately should any occur or go to the ER.  Return in about 2 weeks (around 11/24/2023) for nail check.  Donnice JONELLE Fees DPM

## 2023-11-11 ENCOUNTER — Other Ambulatory Visit: Payer: Self-pay

## 2023-11-11 ENCOUNTER — Other Ambulatory Visit (HOSPITAL_COMMUNITY): Payer: Self-pay

## 2023-11-11 MED ORDER — INSULIN LISPRO (1 UNIT DIAL) 100 UNIT/ML (KWIKPEN)
PEN_INJECTOR | SUBCUTANEOUS | 5 refills | Status: AC
  Filled 2023-11-11: qty 9, 30d supply, fill #0

## 2023-11-14 ENCOUNTER — Other Ambulatory Visit (HOSPITAL_COMMUNITY): Payer: Self-pay

## 2023-11-15 ENCOUNTER — Other Ambulatory Visit: Payer: Self-pay

## 2023-11-15 ENCOUNTER — Other Ambulatory Visit (HOSPITAL_COMMUNITY): Payer: Self-pay

## 2023-11-15 ENCOUNTER — Encounter (HOSPITAL_COMMUNITY): Payer: Self-pay

## 2023-11-15 DIAGNOSIS — E782 Mixed hyperlipidemia: Secondary | ICD-10-CM

## 2023-11-15 MED ORDER — TAMSULOSIN HCL 0.4 MG PO CAPS
0.4000 mg | ORAL_CAPSULE | Freq: Every day | ORAL | 1 refills | Status: AC
  Filled 2023-11-15: qty 30, 30d supply, fill #0

## 2023-11-15 MED ORDER — LINZESS 72 MCG PO CAPS
72.0000 ug | ORAL_CAPSULE | ORAL | 3 refills | Status: AC
  Filled 2023-11-15: qty 30, 30d supply, fill #0

## 2023-11-15 MED ORDER — IRON 325 (65 FE) MG PO TABS
325.0000 mg | ORAL_TABLET | Freq: Every day | ORAL | 0 refills | Status: AC
  Filled 2023-11-15: qty 30, 30d supply, fill #0

## 2023-11-15 MED ORDER — ATORVASTATIN CALCIUM 40 MG PO TABS
40.0000 mg | ORAL_TABLET | Freq: Every day | ORAL | 1 refills | Status: AC
  Filled 2023-11-15: qty 30, 30d supply, fill #0

## 2023-11-24 ENCOUNTER — Other Ambulatory Visit (HOSPITAL_COMMUNITY): Payer: Self-pay

## 2023-11-24 MED ORDER — GABAPENTIN 100 MG PO CAPS
100.0000 mg | ORAL_CAPSULE | Freq: Every day | ORAL | 5 refills | Status: AC
  Filled 2023-11-24: qty 30, 30d supply, fill #0

## 2023-12-15 ENCOUNTER — Ambulatory Visit: Admitting: Podiatry

## 2023-12-19 ENCOUNTER — Other Ambulatory Visit (HOSPITAL_COMMUNITY): Payer: Self-pay

## 2023-12-19 MED ORDER — MOUNJARO 7.5 MG/0.5ML ~~LOC~~ SOAJ
7.5000 mg | SUBCUTANEOUS | 1 refills | Status: AC
  Filled 2023-12-19: qty 2, 28d supply, fill #0

## 2023-12-19 MED ORDER — LISINOPRIL 10 MG PO TABS
10.0000 mg | ORAL_TABLET | Freq: Every day | ORAL | 5 refills | Status: AC
  Filled 2023-12-19: qty 30, 30d supply, fill #0

## 2023-12-29 ENCOUNTER — Other Ambulatory Visit (HOSPITAL_COMMUNITY): Payer: Self-pay

## 2024-01-23 ENCOUNTER — Other Ambulatory Visit (HOSPITAL_COMMUNITY): Payer: Self-pay
